# Patient Record
Sex: Male | Born: 1985 | Race: White | Hispanic: No | Marital: Married | Smoking: Former smoker
Health system: Southern US, Community
[De-identification: ages and names within clinical notes are randomized; demographics above are authoritative.]

## PROBLEM LIST (undated history)

## (undated) ENCOUNTER — Emergency Department (HOSPITAL_COMMUNITY): Admission: EM | Payer: BC Managed Care – PPO

## (undated) DIAGNOSIS — K529 Noninfective gastroenteritis and colitis, unspecified: Secondary | ICD-10-CM

## (undated) DIAGNOSIS — T8859XA Other complications of anesthesia, initial encounter: Secondary | ICD-10-CM

## (undated) DIAGNOSIS — M549 Dorsalgia, unspecified: Secondary | ICD-10-CM

## (undated) DIAGNOSIS — I10 Essential (primary) hypertension: Secondary | ICD-10-CM

## (undated) DIAGNOSIS — F909 Attention-deficit hyperactivity disorder, unspecified type: Secondary | ICD-10-CM

## (undated) DIAGNOSIS — E119 Type 2 diabetes mellitus without complications: Secondary | ICD-10-CM

## (undated) HISTORY — PX: WISDOM TOOTH EXTRACTION: SHX21

## (undated) HISTORY — PX: UMBILICAL HERNIA REPAIR: SHX196

---

## 2014-08-17 ENCOUNTER — Other Ambulatory Visit: Payer: Self-pay | Admitting: Internal Medicine

## 2014-08-17 ENCOUNTER — Other Ambulatory Visit (HOSPITAL_COMMUNITY): Payer: Self-pay | Admitting: Internal Medicine

## 2014-08-17 DIAGNOSIS — M545 Low back pain, unspecified: Secondary | ICD-10-CM

## 2014-08-20 ENCOUNTER — Other Ambulatory Visit: Payer: Self-pay | Admitting: Internal Medicine

## 2014-08-20 DIAGNOSIS — M795 Residual foreign body in soft tissue: Secondary | ICD-10-CM

## 2014-08-21 ENCOUNTER — Other Ambulatory Visit: Payer: Self-pay

## 2014-08-23 ENCOUNTER — Ambulatory Visit
Admission: RE | Admit: 2014-08-23 | Discharge: 2014-08-23 | Disposition: A | Discharge status: Home / Self Care | Payer: BC Managed Care – PPO | Source: Ambulatory Visit | Attending: Internal Medicine | Admitting: Internal Medicine

## 2014-08-23 ENCOUNTER — Other Ambulatory Visit: Payer: Self-pay | Admitting: Internal Medicine

## 2014-08-23 DIAGNOSIS — M795 Residual foreign body in soft tissue: Secondary | ICD-10-CM

## 2014-08-23 DIAGNOSIS — M545 Low back pain, unspecified: Secondary | ICD-10-CM

## 2014-12-22 ENCOUNTER — Emergency Department (HOSPITAL_COMMUNITY)
Admission: EM | Admit: 2014-12-22 | Discharge: 2014-12-22 | Disposition: A | Discharge status: Home / Self Care | Payer: BLUE CROSS/BLUE SHIELD | Attending: Emergency Medicine | Admitting: Emergency Medicine

## 2014-12-22 ENCOUNTER — Emergency Department (HOSPITAL_COMMUNITY): Payer: BLUE CROSS/BLUE SHIELD

## 2014-12-22 ENCOUNTER — Encounter (HOSPITAL_COMMUNITY): Payer: Self-pay | Admitting: Emergency Medicine

## 2014-12-22 DIAGNOSIS — Z880 Allergy status to penicillin: Secondary | ICD-10-CM | POA: Diagnosis not present

## 2014-12-22 DIAGNOSIS — R1012 Left upper quadrant pain: Secondary | ICD-10-CM | POA: Diagnosis not present

## 2014-12-22 DIAGNOSIS — R1032 Left lower quadrant pain: Secondary | ICD-10-CM | POA: Diagnosis not present

## 2014-12-22 DIAGNOSIS — R109 Unspecified abdominal pain: Secondary | ICD-10-CM | POA: Diagnosis present

## 2014-12-22 HISTORY — DX: Dorsalgia, unspecified: M54.9

## 2014-12-22 LAB — COMPREHENSIVE METABOLIC PANEL
ALT: 39 U/L (ref 0–53)
AST: 25 U/L (ref 0–37)
Albumin: 4 g/dL (ref 3.5–5.2)
Alkaline Phosphatase: 88 U/L (ref 39–117)
Anion gap: 9 (ref 5–15)
BILIRUBIN TOTAL: 0.5 mg/dL (ref 0.3–1.2)
BUN: 18 mg/dL (ref 6–23)
CALCIUM: 9.2 mg/dL (ref 8.4–10.5)
CHLORIDE: 103 mmol/L (ref 96–112)
CO2: 24 mmol/L (ref 19–32)
Creatinine, Ser: 0.83 mg/dL (ref 0.50–1.35)
GFR calc Af Amer: >90 mL/min (ref >90)
GFR calc non Af Amer: >90 mL/min (ref >90)
Glucose, Bld: 123 mg/dL — ABNORMAL HIGH (ref 70–99)
Potassium: 3.8 mmol/L (ref 3.5–5.1)
SODIUM: 136 mmol/L (ref 135–145)
Total Protein: 7.7 g/dL (ref 6.0–8.3)

## 2014-12-22 LAB — URINALYSIS, ROUTINE W REFLEX MICROSCOPIC
Bilirubin Urine: NEGATIVE
GLUCOSE, UA: NEGATIVE mg/dL
HGB URINE DIPSTICK: NEGATIVE
Ketones, ur: NEGATIVE mg/dL
LEUKOCYTES UA: NEGATIVE
Nitrite: NEGATIVE
PH: 5.5 (ref 5.0–8.0)
Protein, ur: NEGATIVE mg/dL
Specific Gravity, Urine: >1.03 — ABNORMAL HIGH (ref 1.005–1.030)
Urobilinogen, UA: 0.2 mg/dL (ref 0.0–1.0)

## 2014-12-22 LAB — CBC WITH DIFFERENTIAL/PLATELET
Basophils Absolute: 0.1 10*3/uL (ref 0.0–0.1)
Basophils Relative: 1 % (ref 0–1)
Eosinophils Absolute: 0.4 10*3/uL (ref 0.0–0.7)
Eosinophils Relative: 3 % (ref 0–5)
HEMATOCRIT: 45.4 % (ref 39.0–52.0)
HEMOGLOBIN: 16 g/dL (ref 13.0–17.0)
LYMPHS ABS: 3.9 10*3/uL (ref 0.7–4.0)
Lymphocytes Relative: 26 % (ref 12–46)
MCH: 30.2 pg (ref 26.0–34.0)
MCHC: 35.2 g/dL (ref 30.0–36.0)
MCV: 85.7 fL (ref 78.0–100.0)
MONOS PCT: 7 % (ref 3–12)
Monocytes Absolute: 1 10*3/uL (ref 0.1–1.0)
NEUTROS ABS: 9.2 10*3/uL — AB (ref 1.7–7.7)
NEUTROS PCT: 63 % (ref 43–77)
Platelets: 345 10*3/uL (ref 150–400)
RBC: 5.3 MIL/uL (ref 4.22–5.81)
RDW: 12.5 % (ref 11.5–15.5)
WBC: 14.6 10*3/uL — ABNORMAL HIGH (ref 4.0–10.5)

## 2014-12-22 LAB — LIPASE, BLOOD: Lipase: 19 U/L (ref 11–59)

## 2014-12-22 LAB — AMYLASE: AMYLASE: 35 U/L (ref 0–105)

## 2014-12-22 MED ORDER — IOHEXOL 300 MG/ML  SOLN
50.0000 mL | Freq: Once | INTRAMUSCULAR | Status: AC | PRN
  Administered 2014-12-22: 50 mL via ORAL

## 2014-12-22 MED ORDER — SODIUM CHLORIDE 0.9 % IV BOLUS (SEPSIS)
1000.0000 mL | Freq: Once | INTRAVENOUS | Status: AC
  Administered 2014-12-22: 1000 mL via INTRAVENOUS

## 2014-12-22 MED ORDER — SUCRALFATE 1 GM/10ML PO SUSP: 1.0000 g | Freq: Three times a day (TID) | ORAL | Status: DC

## 2014-12-22 MED ORDER — HYDROMORPHONE HCL 1 MG/ML IJ SOLN
1.0000 mg | Freq: Once | INTRAMUSCULAR | Status: AC
Start: 2014-12-22 — End: 2014-12-22
  Administered 2014-12-22: 1 mg via INTRAVENOUS
  Filled 2014-12-22: qty 1

## 2014-12-22 MED ORDER — IOHEXOL 300 MG/ML  SOLN
100.0000 mL | Freq: Once | INTRAMUSCULAR | Status: AC | PRN
  Administered 2014-12-22: 100 mL via INTRAVENOUS

## 2014-12-22 MED ORDER — PANTOPRAZOLE SODIUM 20 MG PO TBEC: 20.0000 mg | DELAYED_RELEASE_TABLET | Freq: Two times a day (BID) | ORAL | Status: DC

## 2014-12-22 MED ORDER — ONDANSETRON HCL 4 MG/2ML IJ SOLN
4.0000 mg | Freq: Once | INTRAMUSCULAR | Status: AC
  Administered 2014-12-22: 4 mg via INTRAVENOUS
  Filled 2014-12-22: qty 2

## 2014-12-22 NOTE — ED Provider Notes (Addendum)
CSN: [id number]     Arrival date & time 12/22/14  1604 History   First MD Initiated Contact with Patient 12/22/14 1730     Chief Complaint  Patient presents with  . Flank Pain     (Consider location/radiation/quality/duration/timing/severity/associated sxs/prior Treatment) HPI Comments: Patient presents to the ER for evaluation of pain on the left side of his abdomen. He reports that he has been experiencing pain for the last 3 days. Pain has been constant, dull aching on the left side but he has intermittent episodes of sharp stabbing pain. He has associated nausea but has not had vomiting. He has not had any diarrhea. Patient denies fever. He denies urinary symptoms.  Patient is a 30 y.o. male presenting with flank pain.  Flank Pain Associated symptoms include abdominal pain.    Past Medical History  Diagnosis Date  . Back pain    History reviewed. No pertinent past surgical history. History reviewed. No pertinent family history. History  Substance Use Topics  . Smoking status: Never Smoker   . Smokeless tobacco: Current User    Types: Snuff  . Alcohol Use: No    Review of Systems  Gastrointestinal: Positive for abdominal pain.  Genitourinary: Positive for flank pain.  All other systems reviewed and are negative.     Allergies  Penicillins  Home Medications   Prior to Admission medications   Medication Sig Start Date End Date Taking? Authorizing Provider  HYDROcodone-acetaminophen (NORCO/VICODIN) 5-325 MG per tablet Take 1 tablet by mouth daily as needed for moderate pain or severe pain.   Yes Historical Provider, MD   BP 153/113 mmHg  Pulse 90  Temp(Src) 97.9 F (36.6 C) (Oral)  Resp 18  Ht 6' 3"  (1.905 m)  Wt 345 lb (156.491 kg)  BMI 43.12 kg/m2  SpO2 100% Physical Exam  Constitutional: He is oriented to person, place, and time. He appears well-developed and well-nourished. No distress.  HENT:  Head: Normocephalic and atraumatic.  Right Ear: Hearing  normal.  Left Ear: Hearing normal.  Nose: Nose normal.  Mouth/Throat: Oropharynx is clear and moist and mucous membranes are normal.  Eyes: Conjunctivae and EOM are normal. Pupils are equal, round, and reactive to light.  Neck: Normal range of motion. Neck supple.  Cardiovascular: Regular rhythm, S1 normal and S2 normal.  Exam reveals no gallop and no friction rub.   No murmur heard. Pulmonary/Chest: Effort normal and breath sounds normal. No respiratory distress. He exhibits no tenderness.  Abdominal: Soft. Normal appearance and bowel sounds are normal. There is no hepatosplenomegaly. There is tenderness in the left upper quadrant and left lower quadrant. There is no rebound, no guarding, no tenderness at McBurney's point and negative Murphy's sign. No hernia.  Musculoskeletal: Normal range of motion.  Neurological: He is alert and oriented to person, place, and time. He has normal strength. No cranial nerve deficit or sensory deficit. Coordination normal. GCS eye subscore is 4. GCS verbal subscore is 5. GCS motor subscore is 6.  Skin: Skin is warm, dry and intact. No rash noted. No cyanosis.  Psychiatric: He has a normal mood and affect. His speech is normal and behavior is normal. Thought content normal.  Nursing note and vitals reviewed.   ED Course  Procedures (including critical care time) Labs Review Labs Reviewed  CBC WITH DIFFERENTIAL/PLATELET - Abnormal; Notable for the following:    WBC 14.6 (*)    Neutro Abs 9.2 (*)    All other components within normal limits  COMPREHENSIVE  METABOLIC PANEL - Abnormal; Notable for the following:    Glucose, Bld 123 (*)    All other components within normal limits  URINALYSIS, ROUTINE W REFLEX MICROSCOPIC - Abnormal; Notable for the following:    Specific Gravity, Urine >1.030 (*)    All other components within normal limits  LIPASE, BLOOD  AMYLASE    Imaging Review Ct Abdomen Pelvis W Contrast  12/22/2014   CLINICAL DATA:  LEFT lower  quadrant and LEFT upper quadrant pain for 3 days. Nausea. Constipation.  EXAM: CT ABDOMEN AND PELVIS WITH CONTRAST  TECHNIQUE: Multidetector CT imaging of the abdomen and pelvis was performed using the standard protocol following bolus administration of intravenous contrast.  CONTRAST:  70m OMNIPAQUE IOHEXOL 300 MG/ML SOLN, 1040mOMNIPAQUE IOHEXOL 300 MG/ML SOLN  COMPARISON:  None.  FINDINGS: Musculoskeletal: No aggressive osseous lesions. Schmorl's nodes throughout the thoracolumbar spine.  Lung Bases: Clear.  Liver:  Normal.  Spleen:  Normal.  Gallbladder:  Normal.  Common bile duct:  Normal.  Pancreas:  Normal.  Adrenal glands:  Normal.  Kidneys: Normal enhancement. No calculi. LEFT ureter normal. Phlebolith adjacent to the distal LEFT ureter. RIGHT ureter normal.  Stomach:  Normal.  Small bowel:  Normal.  Contrast extends to the distal jejunum.  Colon: Normal appendix. The colon is within normal limits with redundant sigmoid.  Pelvic Genitourinary:  Normal.  Peritoneum: No free fluid or free air.  Vasculature: Normal.  Body Wall: Fat containing periumbilical hernia.  IMPRESSION: Negative CT abdomen and pelvis.   Electronically Signed   By: GeDereck Ligas.D.   On: 12/22/2014 20:22     EKG Interpretation None      MDM   Final diagnoses:  Abdominal pain    Patient presents to the ER for evaluation of left-sided abdominal pain. Symptoms have been ongoing for 3 days. Patient is primarily experiencing left upper abdominal discomfort, but he does have pain down into the lower portion of his abdomen. He has associated nausea but no vomiting. Examination revealed diffuse tenderness on the left side of the abdomen without any right-sided tenderness. He does not have signs of peritonitis. Lab work was normal except for mild leukocytosis. Patient had CT scan to further evaluate. No acute abnormality is seen. Cause of the pain is unclear at this time. Patient provided analgesia, maximize antacid for  possible GERD/gastritis, will follow-up with gastroenterology and primary doctor.    ChOrpah GreekMD 12/22/14 2040  ChOrpah GreekMD 12/22/14 2103

## 2014-12-22 NOTE — Discharge Instructions (Signed)

## 2014-12-22 NOTE — ED Notes (Signed)
Pt states that he has been having left lower quad pain going into back for the past 3 days.  States feels nauseated but cannot vomit and has not been able to have a bowel movement easily.

## 2015-08-04 ENCOUNTER — Encounter (HOSPITAL_COMMUNITY): Payer: Self-pay | Admitting: Emergency Medicine

## 2015-08-04 ENCOUNTER — Emergency Department (HOSPITAL_COMMUNITY)
Admission: EM | Admit: 2015-08-04 | Discharge: 2015-08-04 | Disposition: A | Discharge status: Home / Self Care | Payer: BLUE CROSS/BLUE SHIELD | Attending: Emergency Medicine | Admitting: Emergency Medicine

## 2015-08-04 ENCOUNTER — Emergency Department (HOSPITAL_COMMUNITY): Payer: BLUE CROSS/BLUE SHIELD

## 2015-08-04 DIAGNOSIS — Z880 Allergy status to penicillin: Secondary | ICD-10-CM | POA: Diagnosis not present

## 2015-08-04 DIAGNOSIS — R079 Chest pain, unspecified: Secondary | ICD-10-CM | POA: Diagnosis present

## 2015-08-04 DIAGNOSIS — R0789 Other chest pain: Secondary | ICD-10-CM | POA: Insufficient documentation

## 2015-08-04 DIAGNOSIS — Z792 Long term (current) use of antibiotics: Secondary | ICD-10-CM | POA: Diagnosis not present

## 2015-08-04 LAB — CBC WITH DIFFERENTIAL/PLATELET
BASOS ABS: 0.1 10*3/uL (ref 0.0–0.1)
BASOS PCT: 1 %
EOS PCT: 5 %
Eosinophils Absolute: 0.5 10*3/uL (ref 0.0–0.7)
HCT: 44.8 % (ref 39.0–52.0)
Hemoglobin: 15.8 g/dL (ref 13.0–17.0)
LYMPHS PCT: 34 %
Lymphs Abs: 3.4 10*3/uL (ref 0.7–4.0)
MCH: 29.5 pg (ref 26.0–34.0)
MCHC: 35.3 g/dL (ref 30.0–36.0)
MCV: 83.7 fL (ref 78.0–100.0)
Monocytes Absolute: 0.7 10*3/uL (ref 0.1–1.0)
Monocytes Relative: 7 %
NEUTROS ABS: 5.5 10*3/uL (ref 1.7–7.7)
Neutrophils Relative %: 53 %
PLATELETS: 294 10*3/uL (ref 150–400)
RBC: 5.35 MIL/uL (ref 4.22–5.81)
RDW: 12.7 % (ref 11.5–15.5)
WBC: 10.1 10*3/uL (ref 4.0–10.5)

## 2015-08-04 LAB — COMPREHENSIVE METABOLIC PANEL
ALT: 34 U/L (ref 17–63)
AST: 29 U/L (ref 15–41)
Albumin: 3.8 g/dL (ref 3.5–5.0)
Alkaline Phosphatase: 95 U/L (ref 38–126)
Anion gap: 7 (ref 5–15)
BUN: 15 mg/dL (ref 6–20)
CHLORIDE: 104 mmol/L (ref 101–111)
CO2: 27 mmol/L (ref 22–32)
Calcium: 8.9 mg/dL (ref 8.9–10.3)
Creatinine, Ser: 0.79 mg/dL (ref 0.61–1.24)
GFR calc non Af Amer: >60 mL/min (ref >60)
Glucose, Bld: 106 mg/dL — ABNORMAL HIGH (ref 65–99)
Potassium: 4 mmol/L (ref 3.5–5.1)
SODIUM: 138 mmol/L (ref 135–145)
Total Bilirubin: 0.8 mg/dL (ref 0.3–1.2)
Total Protein: 7.4 g/dL (ref 6.5–8.1)

## 2015-08-04 LAB — TROPONIN I: Troponin I: <0.03 ng/mL (ref <0.031)

## 2015-08-04 MED ORDER — IBUPROFEN 800 MG PO TABS: 800.0000 mg | ORAL_TABLET | Freq: Three times a day (TID) | ORAL | Status: DC

## 2015-08-04 NOTE — ED Notes (Signed)
Pt reports midsternal cp since last night with diaphoresis and radiation to left arm. Pt alert and oriented, has not taken anything for pain, no cardiac hx.

## 2015-08-04 NOTE — Discharge Instructions (Signed)

## 2015-08-04 NOTE — ED Provider Notes (Signed)
CSN: [id number]     Arrival date & time 08/04/15  1022 History   First MD Initiated Contact with Patient 08/04/15 1131     Chief Complaint  Patient presents with  . Chest Pain     (Consider location/radiation/quality/duration/timing/severity/associated sxs/prior Treatment) Patient is a 29 y.o. male presenting with chest pain. The history is provided by the patient. No language interpreter was used.  Chest Pain Pain location:  Substernal area Pain quality: aching   Pain radiates to:  L arm Pain radiates to the back: no   Pain severity:  Moderate Onset quality:  Unable to specify Duration:  1 day Timing:  Constant Progression:  Worsening Chronicity:  New Context: breathing and movement   Context: not lifting   Relieved by:  Nothing Worsened by:  Nothing tried Associated symptoms: no abdominal pain     Past Medical History  Diagnosis Date  . Back pain    History reviewed. No pertinent past surgical history. History reviewed. No pertinent family history. Social History  Substance Use Topics  . Smoking status: Never Smoker   . Smokeless tobacco: Current User    Types: Snuff  . Alcohol Use: Yes     Comment: social    Review of Systems  Cardiovascular: Positive for chest pain.  Gastrointestinal: Negative for abdominal pain.  All other systems reviewed and are negative.     Allergies  Penicillins  Home Medications   Prior to Admission medications   Medication Sig Start Date End Date Taking? Authorizing Provider  HYDROcodone-acetaminophen (NORCO/VICODIN) 5-325 MG per tablet Take 1 tablet by mouth daily as needed for moderate pain or severe pain.   Yes Historical Provider, MD  pantoprazole (PROTONIX) 20 MG tablet Take 1 tablet (20 mg total) by mouth 2 (two) times daily. Patient taking differently: Take 20 mg by mouth daily as needed for heartburn or indigestion.  12/22/14  Yes Orpah Greek, MD  cefUROXime (CEFTIN) 500 MG tablet Take 1 tablet by mouth 2  (two) times daily. 07/20/15   Historical Provider, MD   BP 153/95 mmHg  Pulse 70  Temp(Src) 97.9 F (36.6 C) (Oral)  Resp 19  Ht 6' 3"  (1.905 m)  Wt 354 lb (160.573 kg)  BMI 44.25 kg/m2  SpO2 98% Physical Exam  Constitutional: He appears well-developed and well-nourished.  HENT:  Head: Normocephalic and atraumatic.  Eyes: Conjunctivae are normal. Pupils are equal, round, and reactive to light.  Neck: Normal range of motion. Neck supple.  Cardiovascular: Normal rate, regular rhythm, normal heart sounds and intact distal pulses.   Pulmonary/Chest: Effort normal.  Abdominal: Soft.  Musculoskeletal: Normal range of motion.  Neurological: He is alert.  Skin: Skin is warm.  Psychiatric: He has a normal mood and affect.  Nursing note and vitals reviewed.   ED Course  Procedures (including critical care time) Labs Review Labs Reviewed - No data to display  Imaging Review Dg Chest 2 View  08/04/2015  CLINICAL DATA:  Chest pain with diaphoresis. EXAM: CHEST  2 VIEW COMPARISON:  None. FINDINGS: Midline trachea. Borderline cardiomegaly. Mediastinal contours otherwise within normal limits. No pleural effusion or pneumothorax. No congestive failure. Clear lungs. IMPRESSION: Borderline cardiomegaly, without acute disease. Electronically Signed   By: Abigail Miyamoto M.D.   On: 08/04/2015 13:03   I have personally reviewed and evaluated these images and lab results as part of my medical decision-making.   EKG Interpretation   Date/Time:  Wednesday August 04 2015 10:31:55 EDT Ventricular Rate:  70 PR  Interval:  177 QRS Duration: 109 QT Interval:  387 QTC Calculation: 418 R Axis:   80 Text Interpretation:  Sinus rhythm Confirmed by ZAMMIT  MD, JOSEPH ([id number])  on 08/04/2015 2:21:30 PM      MDM no leg swelling, no tachycardia.   Pt has normal ekg, normal chest xray.   Pt has negative troponin.  Pt began approximately 24 hours ago.   Pt advised to follow up with Dr. Nevada Crane.  I suspect pain  is muscular.  I doubt PE.  I doubt cardiac etiology   Final diagnoses:  Chest wall pain     rx for ibuprofen avs on chest wall pain   Fransico Meadow, PA-C 08/04/15 Bee, PA-C 08/04/15 1425  Milton Ferguson, MD 08/05/15 1536

## 2019-01-28 ENCOUNTER — Other Ambulatory Visit: Payer: Self-pay

## 2019-01-28 ENCOUNTER — Other Ambulatory Visit: Payer: Self-pay | Admitting: Internal Medicine

## 2019-01-28 ENCOUNTER — Ambulatory Visit (HOSPITAL_COMMUNITY)
Admission: RE | Admit: 2019-01-28 | Discharge: 2019-01-28 | Disposition: A | Discharge status: Home / Self Care | Payer: Commercial Managed Care - PPO | Source: Ambulatory Visit | Attending: Internal Medicine | Admitting: Internal Medicine

## 2019-01-28 DIAGNOSIS — M545 Low back pain, unspecified: Secondary | ICD-10-CM

## 2019-02-03 ENCOUNTER — Other Ambulatory Visit: Payer: Self-pay | Admitting: Internal Medicine

## 2019-02-03 DIAGNOSIS — M545 Low back pain, unspecified: Secondary | ICD-10-CM

## 2019-12-15 ENCOUNTER — Emergency Department (HOSPITAL_COMMUNITY): Payer: BC Managed Care – PPO

## 2019-12-15 ENCOUNTER — Encounter (HOSPITAL_COMMUNITY): Payer: Self-pay | Admitting: Emergency Medicine

## 2019-12-15 ENCOUNTER — Inpatient Hospital Stay (HOSPITAL_COMMUNITY)
Admission: EM | Admit: 2019-12-15 | Discharge: 2019-12-21 | DRG: 392 | Disposition: A | Discharge status: Home / Self Care | Payer: BC Managed Care – PPO | Attending: Internal Medicine | Admitting: Internal Medicine

## 2019-12-15 DIAGNOSIS — D751 Secondary polycythemia: Secondary | ICD-10-CM

## 2019-12-15 DIAGNOSIS — K5100 Ulcerative (chronic) pancolitis without complications: Secondary | ICD-10-CM | POA: Diagnosis not present

## 2019-12-15 DIAGNOSIS — F17290 Nicotine dependence, other tobacco product, uncomplicated: Secondary | ICD-10-CM | POA: Diagnosis present

## 2019-12-15 DIAGNOSIS — Z20822 Contact with and (suspected) exposure to covid-19: Secondary | ICD-10-CM | POA: Diagnosis present

## 2019-12-15 DIAGNOSIS — E860 Dehydration: Secondary | ICD-10-CM | POA: Diagnosis present

## 2019-12-15 DIAGNOSIS — Z8719 Personal history of other diseases of the digestive system: Secondary | ICD-10-CM

## 2019-12-15 DIAGNOSIS — Z7984 Long term (current) use of oral hypoglycemic drugs: Secondary | ICD-10-CM

## 2019-12-15 DIAGNOSIS — E872 Acidosis: Secondary | ICD-10-CM | POA: Diagnosis present

## 2019-12-15 DIAGNOSIS — Z79899 Other long term (current) drug therapy: Secondary | ICD-10-CM

## 2019-12-15 DIAGNOSIS — R6510 Systemic inflammatory response syndrome (SIRS) of non-infectious origin without acute organ dysfunction: Secondary | ICD-10-CM

## 2019-12-15 DIAGNOSIS — Z6841 Body Mass Index (BMI) 40.0 and over, adult: Secondary | ICD-10-CM

## 2019-12-15 DIAGNOSIS — A09 Infectious gastroenteritis and colitis, unspecified: Principal | ICD-10-CM | POA: Diagnosis present

## 2019-12-15 DIAGNOSIS — Z86010 Personal history of colonic polyps: Secondary | ICD-10-CM

## 2019-12-15 DIAGNOSIS — K635 Polyp of colon: Secondary | ICD-10-CM | POA: Diagnosis present

## 2019-12-15 DIAGNOSIS — Z800 Family history of malignant neoplasm of digestive organs: Secondary | ICD-10-CM

## 2019-12-15 DIAGNOSIS — R9431 Abnormal electrocardiogram [ECG] [EKG]: Secondary | ICD-10-CM | POA: Diagnosis present

## 2019-12-15 DIAGNOSIS — E876 Hypokalemia: Secondary | ICD-10-CM | POA: Diagnosis present

## 2019-12-15 DIAGNOSIS — I10 Essential (primary) hypertension: Secondary | ICD-10-CM | POA: Diagnosis present

## 2019-12-15 DIAGNOSIS — E119 Type 2 diabetes mellitus without complications: Secondary | ICD-10-CM

## 2019-12-15 DIAGNOSIS — E1165 Type 2 diabetes mellitus with hyperglycemia: Secondary | ICD-10-CM | POA: Diagnosis present

## 2019-12-15 DIAGNOSIS — E1140 Type 2 diabetes mellitus with diabetic neuropathy, unspecified: Secondary | ICD-10-CM | POA: Diagnosis present

## 2019-12-15 HISTORY — DX: Type 2 diabetes mellitus without complications: E11.9

## 2019-12-15 HISTORY — DX: Essential (primary) hypertension: I10

## 2019-12-15 LAB — URINALYSIS, ROUTINE W REFLEX MICROSCOPIC
Bacteria, UA: NONE SEEN
Bilirubin Urine: NEGATIVE
Glucose, UA: 150 mg/dL — AB
Hgb urine dipstick: NEGATIVE
Ketones, ur: NEGATIVE mg/dL
Leukocytes,Ua: NEGATIVE
Nitrite: NEGATIVE
Protein, ur: 30 mg/dL — AB
Specific Gravity, Urine: 1.033 — ABNORMAL HIGH (ref 1.005–1.030)
pH: 5 (ref 5.0–8.0)

## 2019-12-15 LAB — CBC
HCT: 56.4 % — ABNORMAL HIGH (ref 39.0–52.0)
Hemoglobin: 19.4 g/dL — ABNORMAL HIGH (ref 13.0–17.0)
MCH: 28.9 pg (ref 26.0–34.0)
MCHC: 34.4 g/dL (ref 30.0–36.0)
MCV: 84.1 fL (ref 80.0–100.0)
Platelets: 394 10*3/uL (ref 150–400)
RBC: 6.71 MIL/uL — ABNORMAL HIGH (ref 4.22–5.81)
RDW: 12.1 % (ref 11.5–15.5)
WBC: 17.6 10*3/uL — ABNORMAL HIGH (ref 4.0–10.5)
nRBC: 0 % (ref 0.0–0.2)

## 2019-12-15 LAB — COMPREHENSIVE METABOLIC PANEL
ALT: 30 U/L (ref 0–44)
AST: 17 U/L (ref 15–41)
Albumin: 3.6 g/dL (ref 3.5–5.0)
Alkaline Phosphatase: 92 U/L (ref 38–126)
Anion gap: 13 (ref 5–15)
BUN: 20 mg/dL (ref 6–20)
CO2: 16 mmol/L — ABNORMAL LOW (ref 22–32)
Calcium: 9.2 mg/dL (ref 8.9–10.3)
Chloride: 108 mmol/L (ref 98–111)
Creatinine, Ser: 0.89 mg/dL (ref 0.61–1.24)
GFR calc Af Amer: >60 mL/min (ref >60)
GFR calc non Af Amer: >60 mL/min (ref >60)
Glucose, Bld: 239 mg/dL — ABNORMAL HIGH (ref 70–99)
Potassium: 4.4 mmol/L (ref 3.5–5.1)
Sodium: 137 mmol/L (ref 135–145)
Total Bilirubin: 0.6 mg/dL (ref 0.3–1.2)
Total Protein: 7.6 g/dL (ref 6.5–8.1)

## 2019-12-15 LAB — LIPASE, BLOOD: Lipase: 25 U/L (ref 11–51)

## 2019-12-15 MED ORDER — HYDROMORPHONE HCL 1 MG/ML IJ SOLN
1.0000 mg | Freq: Once | INTRAMUSCULAR | Status: AC
  Administered 2019-12-16: 1 mg via INTRAVENOUS
  Filled 2019-12-15: qty 1

## 2019-12-15 MED ORDER — LEVOFLOXACIN IN D5W 750 MG/150ML IV SOLN: 750.0000 mg | Freq: Once | INTRAVENOUS | Status: DC

## 2019-12-15 MED ORDER — SODIUM CHLORIDE 0.9 % IV SOLN
2.0000 g | INTRAVENOUS | Status: DC
  Administered 2019-12-16 – 2019-12-20 (×6): 2 g via INTRAVENOUS
  Filled 2019-12-15: qty 20
  Filled 2019-12-15 (×2): qty 2
  Filled 2019-12-15: qty 20
  Filled 2019-12-15 (×2): qty 2

## 2019-12-15 MED ORDER — ONDANSETRON HCL 4 MG/2ML IJ SOLN
4.0000 mg | Freq: Once | INTRAMUSCULAR | Status: AC
  Administered 2019-12-16: 4 mg via INTRAVENOUS
  Filled 2019-12-15: qty 2

## 2019-12-15 MED ORDER — SODIUM CHLORIDE 0.9% FLUSH: 3.0000 mL | Freq: Once | INTRAVENOUS | Status: DC

## 2019-12-15 MED ORDER — SODIUM CHLORIDE 0.9 % IV BOLUS
1000.0000 mL | Freq: Once | INTRAVENOUS | Status: AC
  Administered 2019-12-16: 1000 mL via INTRAVENOUS

## 2019-12-15 MED ORDER — METRONIDAZOLE IN NACL 5-0.79 MG/ML-% IV SOLN
500.0000 mg | Freq: Once | INTRAVENOUS | Status: AC
  Administered 2019-12-16: 500 mg via INTRAVENOUS
  Filled 2019-12-15: qty 100

## 2019-12-15 NOTE — ED Triage Notes (Signed)
Pt in POV. Reports RUQ abd pain, N/V/D since this AM. Pt appears to be in discomfort in triage. Tachycardic.

## 2019-12-15 NOTE — ED Provider Notes (Signed)
[facility] EMERGENCY DEPARTMENT Provider Note   CSN: [id number] Arrival date & time: 12/15/19  2056     History Chief Complaint  Patient presents with  . Abdominal Cramping    Patrick Munoz is a 34 y.o. male.  Patient with past medical history notable for hypertension and diabetes presents to the emergency department with a chief complaint of severe abdominal pain.  He states pain started this morning.  He reports associated nausea, vomiting, and diarrhea.  He states the pain is severe.  Denies any successful treatments prior to arrival.  He states that he has had a small amount of blood in his vomit.  Denies ever experiencing anything like this before.  Denies any fever, chills, shortness of breath.  Denies any dysuria or hematuria.  The history is provided by the patient. No language interpreter was used.       Past Medical History:  Diagnosis Date  . Back pain   . Diabetes mellitus without complication ([place])   . Hypertension     There are no problems to display for this patient.   History reviewed. No pertinent surgical history.     No family history on file.  Social History   Tobacco Use  . Smoking status: Never Smoker  . Smokeless tobacco: Current User    Types: Snuff  Substance Use Topics  . Alcohol use: Yes    Comment: social  . Drug use: No    Home Medications Prior to Admission medications   Medication Sig Start Date End Date Taking? Authorizing Provider  albuterol (VENTOLIN HFA) 108 (90 Base) MCG/ACT inhaler Inhale 2 puffs into the lungs every 4 (four) hours as needed. 10/28/19  Yes [provider]  glimepiride (AMARYL) 2 MG tablet Take 2 mg by mouth every morning. 11/25/19  Yes [provider]  lisinopril (ZESTRIL) 5 MG tablet Take 5 mg by mouth daily. 12/10/19  Yes [provider]  pregabalin (LYRICA) 100 MG capsule Take 100 mg by mouth 2 (two) times daily. 11/25/19  Yes [provider]    TRULICITY 1.5 KC/1.2XN SOPN Inject 1.5 mg into the skin once a week. Tuesdays 11/28/19  Yes [provider]  ibuprofen (ADVIL,MOTRIN) 800 MG tablet Take 1 tablet (800 mg total) by mouth 3 (three) times daily. Patient not taking: Reported on 12/15/2019 08/04/15   Fransico Meadow, PA-C  pantoprazole (PROTONIX) 20 MG tablet Take 1 tablet (20 mg total) by mouth 2 (two) times daily. Patient not taking: Reported on 12/15/2019 12/22/14   Orpah Greek, MD    Allergies    Penicillins  Review of Systems   Review of Systems  All other systems reviewed and are negative.   Physical Exam Updated Vital Signs BP (!) 153/110 (BP Location: Right Arm)   Pulse (!) 129   Temp 98.1 F (36.7 C) (Oral)   Resp (!) 22   Ht 6' 3"  (1.905 m)   Wt (!) 145.2 kg   SpO2 97%   BMI 40.00 kg/m   Physical Exam Vitals and nursing note reviewed.  Constitutional:      Appearance: He is well-developed.  HENT:     Head: Normocephalic and atraumatic.  Eyes:     Conjunctiva/sclera: Conjunctivae normal.  Cardiovascular:     Rate and Rhythm: Regular rhythm. Tachycardia present.     Heart sounds: No murmur.  Pulmonary:     Effort: Pulmonary effort is normal. No respiratory distress.     Breath sounds: Normal breath  sounds.  Abdominal:     Palpations: Abdomen is soft.     Tenderness: There is abdominal tenderness.     Comments: Diffusely tender to palpation, concerning for peritonitis  Musculoskeletal:        General: Normal range of motion.     Cervical back: Neck supple.  Skin:    General: Skin is warm and dry.  Neurological:     Mental Status: He is alert and oriented to person, place, and time.  Psychiatric:        Mood and Affect: Mood normal.        Behavior: Behavior normal.     ED Results / Procedures / Treatments   Labs (all labs ordered are listed, but only abnormal results are displayed) Labs Reviewed  COMPREHENSIVE METABOLIC PANEL - Abnormal; Notable for the following  components:      Result Value   CO2 16 (*)    Glucose, Bld 239 (*)    All other components within normal limits  CBC - Abnormal; Notable for the following components:   WBC 17.6 (*)    RBC 6.71 (*)    Hemoglobin 19.4 (*)    HCT 56.4 (*)    All other components within normal limits  URINALYSIS, ROUTINE W REFLEX MICROSCOPIC - Abnormal; Notable for the following components:   Color, Urine AMBER (*)    APPearance HAZY (*)    Specific Gravity, Urine 1.033 (*)    Glucose, UA 150 (*)    Protein, ur 30 (*)    All other components within normal limits  LIPASE, BLOOD    EKG EKG Interpretation  Date/Time:  Monday December 15 2019 21:45:37 EST Ventricular Rate:  134 PR Interval:  144 QRS Duration: 86 QT Interval:  276 QTC Calculation: 412 R Axis:   66 Text Interpretation: Sinus tachycardia T wave abnormality, consider inferior ischemia Abnormal ECG Confirmed by Thayer Jew [phone]) on 12/16/2019 1:29:24 AM   Radiology CT ABDOMEN PELVIS W CONTRAST  Result Date: 12/16/2019 CLINICAL DATA:  Right lower quadrant abdominal pain, cramping, nausea and vomiting EXAM: CT ABDOMEN AND PELVIS WITH CONTRAST TECHNIQUE: Multidetector CT imaging of the abdomen and pelvis was performed using the standard protocol following bolus administration of intravenous contrast. CONTRAST:  189m OMNIPAQUE IOHEXOL 300 MG/ML  SOLN COMPARISON:  Comparison CT 12/22/2014 FINDINGS: Lower chest: Lung bases are clear. Normal heart size. No pericardial effusion. Hepatobiliary: Diffuse hepatic hypoattenuation compatible with hepatic steatosis. No focal liver abnormality is seen. No gallstones, gallbladder wall thickening, or biliary dilatation. Pancreas: Unremarkable. No pancreatic ductal dilatation or surrounding inflammatory changes. Spleen: Normal in size without focal abnormality. Adrenals/Urinary Tract: Adrenal glands are unremarkable. Kidneys are normal, without renal calculi, focal lesion, or hydronephrosis. Bladder is  unremarkable. Stomach/Bowel: Distal esophagus, stomach and duodenal sweep are unremarkable. No small bowel wall thickening or dilatation. No evidence of obstruction. Normal appendix in the right lower quadrant (6/68) question some mild pancolonic edematous mural thickening and faint mucosal hyperemia. Vascular/Lymphatic: The aorta is normal caliber. No pathologically enlarged lymph nodes in the abdomen or pelvis. Major venous structures are unremarkable. Reproductive: The prostate and seminal vesicles are unremarkable. Other: No abdominopelvic free fluid or free gas. No bowel containing hernias. Musculoskeletal: No acute osseous abnormality or suspicious osseous lesion. Multilevel Schmorl's node formations in the vertebral bodies are similar to comparison study. IMPRESSION: 1. Pancolonic edematous changes could reflect an acute infectious or inflammatory pancolitis. 2. Normal appendix. 3. Hepatic steatosis. Electronically Signed   By: PElwin SleightD.  On: 12/16/2019 00:40   DG Chest Port 1 View  Result Date: 12/15/2019 CLINICAL DATA:  Right upper quadrant pain EXAM: PORTABLE CHEST 1 VIEW COMPARISON:  08/04/2015 FINDINGS: Borderline cardiomegaly. No focal opacity or pleural effusion. No pneumothorax. IMPRESSION: No active disease.  Borderline cardiomegaly Electronically Signed   By: Donavan Foil M.D.   On: 12/15/2019 23:32    Procedures .Critical Care Performed by: Montine Circle, PA-C Authorized by: Montine Circle, PA-C   Critical care provider statement:    Critical care time (minutes):  42   Critical care was necessary to treat or prevent imminent or life-threatening deterioration of the following conditions:  Sepsis   Critical care was time spent personally by me on the following activities:  Discussions with consultants, evaluation of patient's response to treatment, examination of patient, ordering and performing treatments and interventions, ordering and review of laboratory studies,  ordering and review of radiographic studies, pulse oximetry, re-evaluation of patient's condition, obtaining history from patient or surrogate and review of old charts   (including critical care time)  Medications Ordered in ED Medications  sodium chloride flush (NS) 0.9 % injection 3 mL (has no administration in time range)  cefTRIAXone (ROCEPHIN) 2 g in sodium chloride 0.9 % 100 mL IVPB (0 g Intravenous Stopped 12/16/19 0053)  HYDROmorphone (DILAUDID) injection 1 mg (1 mg Intravenous Given 12/16/19 0025)  ondansetron (ZOFRAN) injection 4 mg (4 mg Intravenous Given 12/16/19 0020)  sodium chloride 0.9 % bolus 1,000 mL (0 mLs Intravenous Stopped 12/16/19 0158)  metroNIDAZOLE (FLAGYL) IVPB 500 mg (0 mg Intravenous Stopped 12/16/19 0158)  iohexol (OMNIPAQUE) 300 MG/ML solution 100 mL (100 mLs Intravenous Contrast Given 12/16/19 0006)  sodium chloride 0.9 % bolus 1,000 mL (1,000 mLs Intravenous New Bag/Given 12/16/19 0206)  ondansetron (ZOFRAN) injection 4 mg (4 mg Intravenous Given 12/16/19 0206)    ED Course  I have reviewed the triage vital signs and the nursing notes.  Pertinent labs & imaging results that were available during my care of the patient were reviewed by me and considered in my medical decision making (see chart for details).    MDM Rules/Calculators/A&P                     Patient with severe abdominal pain that has been worsening throughout the day today.  He has associated nausea, vomiting, diarrhea.  He is very tender to palpation.  I do have concern for peritonitis.  He is tachycardic to 129, respiration rate 22, leukocytosis is 17.6.  CT abdomen pelvis show pancolitis, thought to be infectious given significant leukocytosis and vitals.  Plan for admission.  Discussed with Dr. Dina Rich, who agrees with the plan.  2:27 AM Appreciate Dr. Marlowe Sax for admitting.  Final Clinical Impression(s) / ED Diagnoses Final diagnoses:  Pancolitis [facility])    Rx / DC Orders ED Discharge  Orders    None       Montine Circle, PA-C 12/16/19 6503    Merryl Hacker, MD 12/16/19 [phone]

## 2019-12-16 ENCOUNTER — Encounter (HOSPITAL_COMMUNITY): Payer: Self-pay | Admitting: Internal Medicine

## 2019-12-16 ENCOUNTER — Emergency Department (HOSPITAL_COMMUNITY): Payer: BC Managed Care – PPO

## 2019-12-16 DIAGNOSIS — D751 Secondary polycythemia: Secondary | ICD-10-CM | POA: Diagnosis present

## 2019-12-16 DIAGNOSIS — E119 Type 2 diabetes mellitus without complications: Secondary | ICD-10-CM | POA: Diagnosis not present

## 2019-12-16 DIAGNOSIS — Z79899 Other long term (current) drug therapy: Secondary | ICD-10-CM | POA: Diagnosis not present

## 2019-12-16 DIAGNOSIS — Z20822 Contact with and (suspected) exposure to covid-19: Secondary | ICD-10-CM | POA: Diagnosis present

## 2019-12-16 DIAGNOSIS — E872 Acidosis: Secondary | ICD-10-CM | POA: Diagnosis present

## 2019-12-16 DIAGNOSIS — I10 Essential (primary) hypertension: Secondary | ICD-10-CM | POA: Diagnosis present

## 2019-12-16 DIAGNOSIS — K635 Polyp of colon: Secondary | ICD-10-CM | POA: Diagnosis present

## 2019-12-16 DIAGNOSIS — E1165 Type 2 diabetes mellitus with hyperglycemia: Secondary | ICD-10-CM | POA: Diagnosis present

## 2019-12-16 DIAGNOSIS — E1140 Type 2 diabetes mellitus with diabetic neuropathy, unspecified: Secondary | ICD-10-CM | POA: Diagnosis present

## 2019-12-16 DIAGNOSIS — K5100 Ulcerative (chronic) pancolitis without complications: Secondary | ICD-10-CM | POA: Diagnosis present

## 2019-12-16 DIAGNOSIS — F17290 Nicotine dependence, other tobacco product, uncomplicated: Secondary | ICD-10-CM | POA: Diagnosis present

## 2019-12-16 DIAGNOSIS — Z6841 Body Mass Index (BMI) 40.0 and over, adult: Secondary | ICD-10-CM | POA: Diagnosis not present

## 2019-12-16 DIAGNOSIS — Z800 Family history of malignant neoplasm of digestive organs: Secondary | ICD-10-CM | POA: Diagnosis not present

## 2019-12-16 DIAGNOSIS — R6510 Systemic inflammatory response syndrome (SIRS) of non-infectious origin without acute organ dysfunction: Secondary | ICD-10-CM | POA: Diagnosis not present

## 2019-12-16 DIAGNOSIS — R9431 Abnormal electrocardiogram [ECG] [EKG]: Secondary | ICD-10-CM | POA: Diagnosis present

## 2019-12-16 DIAGNOSIS — Z7984 Long term (current) use of oral hypoglycemic drugs: Secondary | ICD-10-CM | POA: Diagnosis not present

## 2019-12-16 DIAGNOSIS — Z86010 Personal history of colonic polyps: Secondary | ICD-10-CM | POA: Diagnosis not present

## 2019-12-16 DIAGNOSIS — E876 Hypokalemia: Secondary | ICD-10-CM | POA: Diagnosis present

## 2019-12-16 DIAGNOSIS — E860 Dehydration: Secondary | ICD-10-CM | POA: Diagnosis present

## 2019-12-16 DIAGNOSIS — A09 Infectious gastroenteritis and colitis, unspecified: Secondary | ICD-10-CM | POA: Diagnosis present

## 2019-12-16 DIAGNOSIS — Z8719 Personal history of other diseases of the digestive system: Secondary | ICD-10-CM | POA: Diagnosis not present

## 2019-12-16 LAB — CBC
HCT: 54.6 % — ABNORMAL HIGH (ref 39.0–52.0)
Hemoglobin: 18.8 g/dL — ABNORMAL HIGH (ref 13.0–17.0)
MCH: 29.5 pg (ref 26.0–34.0)
MCHC: 34.4 g/dL (ref 30.0–36.0)
MCV: 85.6 fL (ref 80.0–100.0)
Platelets: 358 10*3/uL (ref 150–400)
RBC: 6.38 MIL/uL — ABNORMAL HIGH (ref 4.22–5.81)
RDW: 12.2 % (ref 11.5–15.5)
WBC: 19.1 10*3/uL — ABNORMAL HIGH (ref 4.0–10.5)
nRBC: 0 % (ref 0.0–0.2)

## 2019-12-16 LAB — SEDIMENTATION RATE: Sed Rate: 5 mm/hr (ref 0–16)

## 2019-12-16 LAB — C DIFFICILE QUICK SCREEN W PCR REFLEX
C Diff antigen: NEGATIVE
C Diff interpretation: NOT DETECTED
C Diff toxin: NEGATIVE

## 2019-12-16 LAB — SARS CORONAVIRUS 2 (TAT 6-24 HRS): SARS Coronavirus 2: NEGATIVE

## 2019-12-16 LAB — GLUCOSE, CAPILLARY
Glucose-Capillary: 160 mg/dL — ABNORMAL HIGH (ref 70–99)
Glucose-Capillary: 182 mg/dL — ABNORMAL HIGH (ref 70–99)
Glucose-Capillary: 196 mg/dL — ABNORMAL HIGH (ref 70–99)
Glucose-Capillary: 221 mg/dL — ABNORMAL HIGH (ref 70–99)
Glucose-Capillary: 227 mg/dL — ABNORMAL HIGH (ref 70–99)

## 2019-12-16 LAB — TROPONIN I (HIGH SENSITIVITY): Troponin I (High Sensitivity): 7 ng/L (ref <18)

## 2019-12-16 LAB — APTT: aPTT: 31 seconds (ref 24–36)

## 2019-12-16 LAB — C-REACTIVE PROTEIN: CRP: 1.5 mg/dL — ABNORMAL HIGH (ref <1.0)

## 2019-12-16 LAB — LACTIC ACID, PLASMA: Lactic Acid, Venous: 1.8 mmol/L (ref 0.5–1.9)

## 2019-12-16 LAB — PROTIME-INR
INR: 1.1 (ref 0.8–1.2)
Prothrombin Time: 13.9 seconds (ref 11.4–15.2)

## 2019-12-16 LAB — CBG MONITORING, ED: Glucose-Capillary: 251 mg/dL — ABNORMAL HIGH (ref 70–99)

## 2019-12-16 LAB — HEMOGLOBIN A1C
Hgb A1c MFr Bld: 8.8 % — ABNORMAL HIGH (ref 4.8–5.6)
Mean Plasma Glucose: 205.86 mg/dL

## 2019-12-16 LAB — HIV ANTIBODY (ROUTINE TESTING W REFLEX): HIV Screen 4th Generation wRfx: NONREACTIVE

## 2019-12-16 MED ORDER — IOHEXOL 300 MG/ML  SOLN
100.0000 mL | Freq: Once | INTRAMUSCULAR | Status: AC | PRN
  Administered 2019-12-16: 100 mL via INTRAVENOUS

## 2019-12-16 MED ORDER — INSULIN ASPART 100 UNIT/ML ~~LOC~~ SOLN
0.0000 [IU] | SUBCUTANEOUS | Status: DC
  Administered 2019-12-16: 2 [IU] via SUBCUTANEOUS
  Administered 2019-12-16 (×2): 3 [IU] via SUBCUTANEOUS
  Administered 2019-12-16: 2 [IU] via SUBCUTANEOUS
  Administered 2019-12-16: 5 [IU] via SUBCUTANEOUS
  Administered 2019-12-17 – 2019-12-18 (×9): 2 [IU] via SUBCUTANEOUS

## 2019-12-16 MED ORDER — SODIUM CHLORIDE 0.9 % IV SOLN: INTRAVENOUS | Status: DC

## 2019-12-16 MED ORDER — LISINOPRIL 5 MG PO TABS
5.0000 mg | ORAL_TABLET | Freq: Every day | ORAL | Status: DC
  Administered 2019-12-16 – 2019-12-21 (×6): 5 mg via ORAL
  Filled 2019-12-16 (×6): qty 1

## 2019-12-16 MED ORDER — ACETAMINOPHEN 325 MG PO TABS: 650.0000 mg | ORAL_TABLET | Freq: Four times a day (QID) | ORAL | Status: DC | PRN

## 2019-12-16 MED ORDER — ONDANSETRON HCL 4 MG/2ML IJ SOLN
4.0000 mg | Freq: Four times a day (QID) | INTRAMUSCULAR | Status: DC | PRN
  Administered 2019-12-16: 4 mg via INTRAVENOUS
  Filled 2019-12-16: qty 2

## 2019-12-16 MED ORDER — ACETAMINOPHEN 650 MG RE SUPP: 650.0000 mg | Freq: Four times a day (QID) | RECTAL | Status: DC | PRN

## 2019-12-16 MED ORDER — ENOXAPARIN SODIUM 80 MG/0.8ML ~~LOC~~ SOLN
70.0000 mg | SUBCUTANEOUS | Status: DC
  Administered 2019-12-16 – 2019-12-20 (×5): 70 mg via SUBCUTANEOUS
  Filled 2019-12-16 (×3): qty 0.8
  Filled 2019-12-16: qty 0.7
  Filled 2019-12-16 (×3): qty 0.8

## 2019-12-16 MED ORDER — HYDRALAZINE HCL 20 MG/ML IJ SOLN
10.0000 mg | INTRAMUSCULAR | Status: DC | PRN
  Administered 2019-12-20: 10 mg via INTRAVENOUS
  Filled 2019-12-16: qty 1

## 2019-12-16 MED ORDER — METRONIDAZOLE IN NACL 5-0.79 MG/ML-% IV SOLN
500.0000 mg | Freq: Three times a day (TID) | INTRAVENOUS | Status: DC
  Administered 2019-12-16 – 2019-12-18 (×7): 500 mg via INTRAVENOUS
  Filled 2019-12-16 (×7): qty 100

## 2019-12-16 MED ORDER — SODIUM CHLORIDE 0.9 % IV BOLUS
1000.0000 mL | Freq: Once | INTRAVENOUS | Status: AC
  Administered 2019-12-16: 1000 mL via INTRAVENOUS

## 2019-12-16 MED ORDER — MORPHINE SULFATE (PF) 2 MG/ML IV SOLN
2.0000 mg | INTRAVENOUS | Status: DC | PRN
  Administered 2019-12-16 – 2019-12-20 (×14): 2 mg via INTRAVENOUS
  Filled 2019-12-16 (×16): qty 1

## 2019-12-16 MED ORDER — NICOTINE 14 MG/24HR TD PT24
14.0000 mg | MEDICATED_PATCH | Freq: Every day | TRANSDERMAL | Status: DC
  Administered 2019-12-16 – 2019-12-20 (×5): 14 mg via TRANSDERMAL
  Filled 2019-12-16 (×6): qty 1

## 2019-12-16 MED ORDER — ONDANSETRON HCL 4 MG/2ML IJ SOLN
4.0000 mg | Freq: Once | INTRAMUSCULAR | Status: AC
  Administered 2019-12-16: 4 mg via INTRAVENOUS
  Filled 2019-12-16: qty 2

## 2019-12-16 MED ORDER — ALBUTEROL SULFATE (2.5 MG/3ML) 0.083% IN NEBU: 2.5000 mg | INHALATION_SOLUTION | RESPIRATORY_TRACT | Status: DC | PRN

## 2019-12-16 NOTE — ED Notes (Signed)
Attempted to call report

## 2019-12-16 NOTE — Consult Note (Signed)
UNASSIGNED GI CONSULT  Reason for Consult: Colitis Referring Physician: [facility]  Kateri Mc HPI: This is a 34 year old male with a PMH of HTN and DM admitted for complaints of abdominal pain, diarrhea, and vomiting.  The patient states the he started to suffer with abdominal pain in the RUQ 3 days ago, on Saturday.  This was followed by vomiting and diarrhea.  The patient reported similar pain one week prior to this past Saturday, but the symptoms were minor and it resolved quickly.  His symptoms continued to persist and he presented to the ER.  A CT scan shows a diffuse mild pan colitis.  His WBC is elevated at 17.6 - 19.1 and his C. Diff is negative.  The GI pathogen panel is still pending  Past Medical History:  Diagnosis Date  . Back pain   . Diabetes mellitus without complication ([place])   . Hypertension     History reviewed. No pertinent surgical history.  Family History  Problem Relation Age of Onset  . Colon cancer Maternal Grandfather   . Stomach cancer Maternal Grandfather     Social History:  reports that he has never smoked. His smokeless tobacco use includes snuff. He reports current alcohol use. He reports that he does not use drugs.  Allergies:   Medications:  Scheduled: . enoxaparin (LOVENOX) injection  70 mg Subcutaneous Q24H  . insulin aspart  0-9 Units Subcutaneous Q4H  . lisinopril  5 mg Oral Daily  . nicotine  14 mg Transdermal Daily  . sodium chloride flush  3 mL Intravenous Once   Continuous: . sodium chloride 75 mL/hr at 12/16/19 1327  . cefTRIAXone (ROCEPHIN)  IV Stopped (12/16/19 0053)  . metronidazole 500 mg (12/16/19 0906)    Results for orders placed or performed during the hospital encounter of 12/15/19 (from the past 24 hour(s))  Urinalysis, Routine w reflex microscopic     Status: Abnormal   Collection Time: 12/15/19  9:40 PM  Result Value Ref Range   Color, Urine AMBER (A) YELLOW   APPearance HAZY (A) CLEAR   Specific  Gravity, Urine 1.033 (H) 1.005 - 1.030   pH 5.0 5.0 - 8.0   Glucose, UA 150 (A) NEGATIVE mg/dL   Hgb urine dipstick NEGATIVE NEGATIVE   Bilirubin Urine NEGATIVE NEGATIVE   Ketones, ur NEGATIVE NEGATIVE mg/dL   Protein, ur 30 (A) NEGATIVE mg/dL   Nitrite NEGATIVE NEGATIVE   Leukocytes,Ua NEGATIVE NEGATIVE   RBC / HPF 0-5 0 - 5 RBC/hpf   WBC, UA 0-5 0 - 5 WBC/hpf   Bacteria, UA NONE SEEN NONE SEEN   Squamous Epithelial / LPF 0-5 0 - 5   Mucus PRESENT    Ca Oxalate Crys, UA PRESENT   Lipase, blood     Status: None   Collection Time: 12/15/19  9:41 PM  Result Value Ref Range   Lipase 25 11 - 51 U/L  Comprehensive metabolic panel     Status: Abnormal   Collection Time: 12/15/19  9:41 PM  Result Value Ref Range   Sodium 137 135 - 145 mmol/L   Potassium 4.4 3.5 - 5.1 mmol/L   Chloride 108 98 - 111 mmol/L   CO2 16 (L) 22 - 32 mmol/L   Glucose, Bld 239 (H) 70 - 99 mg/dL   BUN 20 6 - 20 mg/dL   Creatinine, Ser 0.89 0.61 - 1.24 mg/dL   Calcium 9.2 8.9 - 10.3 mg/dL   Total Protein 7.6 6.5 - 8.1  g/dL   Albumin 3.6 3.5 - 5.0 g/dL   AST 17 15 - 41 U/L   ALT 30 0 - 44 U/L   Alkaline Phosphatase 92 38 - 126 U/L   Total Bilirubin 0.6 0.3 - 1.2 mg/dL   GFR calc non Af Amer >60 >60 mL/min   GFR calc Af Amer >60 >60 mL/min   Anion gap 13 5 - 15  CBC     Status: Abnormal   Collection Time: 12/15/19  9:41 PM  Result Value Ref Range   WBC 17.6 (H) 4.0 - 10.5 K/uL   RBC 6.71 (H) 4.22 - 5.81 MIL/uL   Hemoglobin 19.4 (H) 13.0 - 17.0 g/dL   HCT 56.4 (H) 39.0 - 52.0 %   MCV 84.1 80.0 - 100.0 fL   MCH 28.9 26.0 - 34.0 pg   MCHC 34.4 30.0 - 36.0 g/dL   RDW 12.1 11.5 - 15.5 %   Platelets 394 150 - 400 K/uL   nRBC 0.0 0.0 - 0.2 %  Lactic acid, plasma     Status: None   Collection Time: 12/15/19 11:20 PM  Result Value Ref Range   Lactic Acid, Venous 1.8 0.5 - 1.9 mmol/L  APTT     Status: None   Collection Time: 12/15/19 11:30 PM  Result Value Ref Range   aPTT 31 24 - 36 seconds  Protime-INR      Status: None   Collection Time: 12/15/19 11:30 PM  Result Value Ref Range   Prothrombin Time 13.9 11.4 - 15.2 seconds   INR 1.1 0.8 - 1.2  Blood Culture (routine x 2)     Status: None (Preliminary result)   Collection Time: 12/15/19 11:30 PM   Specimen: BLOOD  Result Value Ref Range   Specimen Description BLOOD LEFT ANTECUBITAL    Special Requests      BOTTLES DRAWN AEROBIC AND ANAEROBIC Blood Culture adequate volume   Culture      NO GROWTH < 12 HOURS Performed at [facility], [address], [place], [place] [address]    Report Status PENDING   Blood Culture (routine x 2)     Status: None (Preliminary result)   Collection Time: 12/15/19 11:30 PM   Specimen: BLOOD LEFT HAND  Result Value Ref Range   Specimen Description BLOOD LEFT HAND    Special Requests      BOTTLES DRAWN AEROBIC AND ANAEROBIC Blood Culture adequate volume   Culture      NO GROWTH < 12 HOURS Performed at [facility], [place] [address] [place], [place] [address]    Report Status PENDING   SARS CORONAVIRUS 2 (TAT 6-24 HRS) Nasopharyngeal Nasopharyngeal Swab     Status: None   Collection Time: 12/16/19  2:09 AM   Specimen: Nasopharyngeal Swab  Result Value Ref Range   SARS Coronavirus 2 NEGATIVE NEGATIVE  C difficile quick scan w PCR reflex     Status: None   Collection Time: 12/16/19  2:56 AM   Specimen: Stool  Result Value Ref Range   C Diff antigen NEGATIVE NEGATIVE   C Diff toxin NEGATIVE NEGATIVE   C Diff interpretation No C. difficile detected.   HIV Antibody (routine testing w rflx)     Status: None   Collection Time: 12/16/19  3:40 AM  Result Value Ref Range   HIV Screen 4th Generation wRfx NON REACTIVE NON REACTIVE  Sedimentation rate     Status: None   Collection Time: 12/16/19  3:40 AM  Result Value Ref Range   Sed Rate 5 0 - 16 mm/hr  C-reactive protein     Status: Abnormal   Collection Time: 12/16/19  3:40 AM  Result Value Ref Range   CRP 1.5 (H) <1.0 mg/dL  CBC      Status: Abnormal   Collection Time: 12/16/19  3:40 AM  Result Value Ref Range   WBC 19.1 (H) 4.0 - 10.5 K/uL   RBC 6.38 (H) 4.22 - 5.81 MIL/uL   Hemoglobin 18.8 (H) 13.0 - 17.0 g/dL   HCT 54.6 (H) 39.0 - 52.0 %   MCV 85.6 80.0 - 100.0 fL   MCH 29.5 26.0 - 34.0 pg   MCHC 34.4 30.0 - 36.0 g/dL   RDW 12.2 11.5 - 15.5 %   Platelets 358 150 - 400 K/uL   nRBC 0.0 0.0 - 0.2 %  Troponin I (High Sensitivity)     Status: None   Collection Time: 12/16/19  3:40 AM  Result Value Ref Range   Troponin I (High Sensitivity) 7 <18 ng/L  Hemoglobin A1c     Status: Abnormal   Collection Time: 12/16/19  3:40 AM  Result Value Ref Range   Hgb A1c MFr Bld 8.8 (H) 4.8 - 5.6 %   Mean Plasma Glucose 205.86 mg/dL  CBG monitoring, ED     Status: Abnormal   Collection Time: 12/16/19  3:53 AM  Result Value Ref Range   Glucose-Capillary 251 (H) 70 - 99 mg/dL  Glucose, capillary     Status: Abnormal   Collection Time: 12/16/19  7:52 AM  Result Value Ref Range   Glucose-Capillary 196 (H) 70 - 99 mg/dL  Glucose, capillary     Status: Abnormal   Collection Time: 12/16/19 12:12 PM  Result Value Ref Range   Glucose-Capillary 160 (H) 70 - 99 mg/dL     CT ABDOMEN PELVIS W CONTRAST  Result Date: 12/16/2019 CLINICAL DATA:  Right lower quadrant abdominal pain, cramping, nausea and vomiting EXAM: CT ABDOMEN AND PELVIS WITH CONTRAST TECHNIQUE: Multidetector CT imaging of the abdomen and pelvis was performed using the standard protocol following bolus administration of intravenous contrast. CONTRAST:  129m OMNIPAQUE IOHEXOL 300 MG/ML  SOLN COMPARISON:  Comparison CT 12/22/2014 FINDINGS: Lower chest: Lung bases are clear. Normal heart size. No pericardial effusion. Hepatobiliary: Diffuse hepatic hypoattenuation compatible with hepatic steatosis. No focal liver abnormality is seen. No gallstones, gallbladder wall thickening, or biliary dilatation. Pancreas: Unremarkable. No pancreatic ductal dilatation or surrounding  inflammatory changes. Spleen: Normal in size without focal abnormality. Adrenals/Urinary Tract: Adrenal glands are unremarkable. Kidneys are normal, without renal calculi, focal lesion, or hydronephrosis. Bladder is unremarkable. Stomach/Bowel: Distal esophagus, stomach and duodenal sweep are unremarkable. No small bowel wall thickening or dilatation. No evidence of obstruction. Normal appendix in the right lower quadrant (6/68) question some mild pancolonic edematous mural thickening and faint mucosal hyperemia. Vascular/Lymphatic: The aorta is normal caliber. No pathologically enlarged lymph nodes in the abdomen or pelvis. Major venous structures are unremarkable. Reproductive: The prostate and seminal vesicles are unremarkable. Other: No abdominopelvic free fluid or free gas. No bowel containing hernias. Musculoskeletal: No acute osseous abnormality or suspicious osseous lesion. Multilevel Schmorl's node formations in the vertebral bodies are similar to comparison study. IMPRESSION: 1. Pancolonic edematous changes could reflect an acute infectious or inflammatory pancolitis. 2. Normal appendix. 3. Hepatic steatosis. Electronically Signed   By: PLovena LeM.D.   On: 12/16/2019 00:40   [facility]  Result Date: 12/15/2019 CLINICAL DATA:  Right upper quadrant pain EXAM: PORTABLE CHEST 1 VIEW COMPARISON:  08/04/2015 FINDINGS: Borderline cardiomegaly. No focal opacity or pleural effusion. No pneumothorax. IMPRESSION: No active disease.  Borderline cardiomegaly Electronically Signed   By: Donavan Foil M.D.   On: 12/15/2019 23:32    ROS:  As stated above in the HPI otherwise negative.  Blood pressure (!) 144/89, pulse 89, temperature 98 F (36.7 C), temperature source Oral, resp. rate 18, height 6' 3"  (1.905 m), weight (!) 145.2 kg, SpO2 97 %.    PE: Gen: Fatigued, uncomfortable HEENT:  [place]/AT, EOMI Neck: Supple, no LAD Lungs: CTA Bilaterally CV: RRR without M/G/R ABM: Soft, diffuse  tenderness with mild palpation, no distension., +BS Ext: No C/C/E  Assessment/Plan: 1) Mild pancolitis. 2) Diarrhea. 3) ABM pain. 4) Nausea/vomiting.   The GI pathogen panel is still pending.  The current findings will benefit with a FFS, if the GI pathogen panel is negative.  Plan: 1) FFS tomorrow with Dr. Collene Mares. 2) Follow up on the GI pathogen panel. Starsky Nanna D 12/16/2019, 4:29 PM

## 2019-12-16 NOTE — Plan of Care (Signed)

## 2019-12-16 NOTE — Plan of Care (Signed)
  Problem: Education: Goal: Knowledge of General Education information will improve Description: Including pain rating scale, medication(s)/side effects and non-pharmacologic comfort measures Outcome: Progressing   Problem: Health Behavior/Discharge Planning: Goal: Ability to manage health-related needs will improve Outcome: Progressing   Problem: Clinical Measurements: Goal: Will remain free from infection Outcome: Progressing Goal: Diagnostic test results will improve Outcome: Progressing Goal: Respiratory complications will improve Outcome: Progressing Goal: Cardiovascular complication will be avoided Outcome: Progressing   Problem: Activity: Goal: Risk for activity intolerance will decrease Outcome: Progressing   Problem: Nutrition: Goal: Adequate nutrition will be maintained Outcome: Progressing   Problem: Coping: Goal: Level of anxiety will decrease Outcome: Progressing   Problem: Elimination: Goal: Will not experience complications related to urinary retention Outcome: Progressing   Problem: Pain Managment: Goal: General experience of comfort will improve Outcome: Progressing   Problem: Safety: Goal: Ability to remain free from injury will improve Outcome: Progressing   Problem: Skin Integrity: Goal: Risk for impaired skin integrity will decrease Outcome: Progressing

## 2019-12-16 NOTE — H&P (Signed)
History and Physical    Patrick Munoz MQK:[id number] DOB: 1986-05-24 DOA: 12/15/2019  PCP: Wenda Low, MD Patient coming from: Abdominal pain  Chief Complaint: Abdominal pain, vomiting, diarrhea  HPI: Patrick Munoz is a 34 y.o. male with medical history significant of non-insulin-dependent type 2 diabetes, hypertension presenting to the ED with complaints of abdominal pain, vomiting, and diarrhea.  Patient reports 3 days history of severe right upper quadrant abdominal pain.  Also having nonbloody, nonbilious emesis and nonbloody diarrhea.  Diarrhea is yellow in color and described as "oily."  He has not been able to tolerate p.o. intake.  Denies recent sick contacts.  No fevers or chills.  Denies history of IBD.  No cough, shortness of breath, or chest pain.  ED Course: Afebrile.  Tachycardic with heart rate up to 120s on arrival.  Blood pressure elevated with systolic up to 657X.  Labs showing WBC count 17.6.  Lactic acid level normal.  Hemoglobin 19.4.  Bicarb 16, anion gap 13.  Blood glucose 239.  Lipase and LFTs normal.  UA not suggestive of infection.  Urine culture pending.  Blood culture x2 pending.  SARS-CoV-2 PCR test pending.  Chest x-ray showing borderline cardiomegaly and no active disease.  CT abdomen pelvis showing findings concerning for acute infectious or inflammatory pancolitis.  Patient received Dilaudid, Zofran, ceftriaxone, metronidazole, and 2 L normal saline boluses.  Review of Systems:  All systems reviewed and apart from history of presenting illness, are negative.  Past Medical History:  Diagnosis Date  . Back pain   . Diabetes mellitus without complication ([place])   . Hypertension     History reviewed. No pertinent surgical history.   reports that he has never smoked. His smokeless tobacco use includes snuff. He reports current alcohol use. He reports that he does not use drugs.  Allergies: Penicillins  Family History  Problem Relation Age of Onset   . Colon cancer Maternal Grandfather   . Stomach cancer Maternal Grandfather     Prior to Admission medications   Medication Sig Start Date End Date Taking? Authorizing Provider  albuterol (VENTOLIN HFA) 108 (90 Base) MCG/ACT inhaler Inhale 2 puffs into the lungs every 4 (four) hours as needed. 10/28/19  Yes [provider]  glimepiride (AMARYL) 2 MG tablet Take 2 mg by mouth every morning. 11/25/19  Yes [provider]  lisinopril (ZESTRIL) 5 MG tablet Take 5 mg by mouth daily. 12/10/19  Yes [provider]  pregabalin (LYRICA) 100 MG capsule Take 100 mg by mouth 2 (two) times daily. 11/25/19  Yes [provider]  TRULICITY 1.5 UX/8.3FX SOPN Inject 1.5 mg into the skin once a week. Tuesdays 11/28/19  Yes [provider]  ibuprofen (ADVIL,MOTRIN) 800 MG tablet Take 1 tablet (800 mg total) by mouth 3 (three) times daily. Patient not taking: Reported on 12/15/2019 08/04/15   Fransico Meadow, PA-C  pantoprazole (PROTONIX) 20 MG tablet Take 1 tablet (20 mg total) by mouth 2 (two) times daily. Patient not taking: Reported on 12/15/2019 12/22/14   Orpah Greek, MD    Physical Exam: Vitals:   12/16/19 0045 12/16/19 0115 12/16/19 0156 12/16/19 0230  BP: (!) 168/118 (!) 171/128 (!) 146/115 (!) 160/108  Pulse: 100 (!) 107 (!) 111 96  Resp: 20 18 14 13   Temp:      TempSrc:      SpO2: 97% (!) 89% 97% 97%  Weight:      Height:        Physical  Exam  Constitutional: He is oriented to person, place, and time. He appears well-developed and well-nourished. No distress.  HENT:  Head: Normocephalic.  Eyes: Right eye exhibits no discharge. Left eye exhibits no discharge.  Cardiovascular: Normal rate, regular rhythm and intact distal pulses.  Pulmonary/Chest: Effort normal and breath sounds normal. No respiratory distress. He has no wheezes. He has no rales.  Abdominal: Soft. Bowel sounds are normal. He exhibits no distension. There is abdominal tenderness.  There is no rebound and no guarding.  Right upper quadrant mildly tender to palpation  Musculoskeletal:        General: No edema.     Cervical back: Neck supple.  Neurological: He is alert and oriented to person, place, and time.  Skin: Skin is warm and dry. He is not diaphoretic.     Labs on Admission: I have personally reviewed following labs and imaging studies  CBC: Recent Labs  Lab 12/15/19 2141  WBC 17.6*  HGB 19.4*  HCT 56.4*  MCV 84.1  PLT 161   Basic Metabolic Panel: Recent Labs  Lab 12/15/19 2141  NA 137  K 4.4  CL 108  CO2 16*  GLUCOSE 239*  BUN 20  CREATININE 0.89  CALCIUM 9.2   GFR: Estimated Creatinine Clearance: 183.4 mL/min (by C-G formula based on SCr of 0.89 mg/dL). Liver Function Tests: Recent Labs  Lab 12/15/19 2141  AST 17  ALT 30  ALKPHOS 92  BILITOT 0.6  PROT 7.6  ALBUMIN 3.6   Recent Labs  Lab 12/15/19 2141  LIPASE 25   No results for input(s): AMMONIA in the last 168 hours. Coagulation Profile: Recent Labs  Lab 12/15/19 2330  INR 1.1   Cardiac Enzymes: No results for input(s): CKTOTAL, CKMB, CKMBINDEX, TROPONINI in the last 168 hours. BNP (last 3 results) No results for input(s): PROBNP in the last 8760 hours. HbA1C: No results for input(s): HGBA1C in the last 72 hours. CBG: No results for input(s): GLUCAP in the last 168 hours. Lipid Profile: No results for input(s): CHOL, HDL, LDLCALC, TRIG, CHOLHDL, LDLDIRECT in the last 72 hours. Thyroid Function Tests: No results for input(s): TSH, T4TOTAL, FREET4, T3FREE, THYROIDAB in the last 72 hours. Anemia Panel: No results for input(s): VITAMINB12, FOLATE, FERRITIN, TIBC, IRON, RETICCTPCT in the last 72 hours. Urine analysis:    Component Value Date/Time   COLORURINE AMBER (A) 12/15/2019 2140   APPEARANCEUR HAZY (A) 12/15/2019 2140   LABSPEC 1.033 (H) 12/15/2019 2140   PHURINE 5.0 12/15/2019 2140   GLUCOSEU 150 (A) 12/15/2019 2140   HGBUR NEGATIVE 12/15/2019 2140    BILIRUBINUR NEGATIVE 12/15/2019 2140   KETONESUR NEGATIVE 12/15/2019 2140   PROTEINUR 30 (A) 12/15/2019 2140   UROBILINOGEN 0.2 12/22/2014 1753   NITRITE NEGATIVE 12/15/2019 2140   LEUKOCYTESUR NEGATIVE 12/15/2019 2140    Radiological Exams on Admission: CT ABDOMEN PELVIS W CONTRAST  Result Date: 12/16/2019 CLINICAL DATA:  Right lower quadrant abdominal pain, cramping, nausea and vomiting EXAM: CT ABDOMEN AND PELVIS WITH CONTRAST TECHNIQUE: Multidetector CT imaging of the abdomen and pelvis was performed using the standard protocol following bolus administration of intravenous contrast. CONTRAST:  159m OMNIPAQUE IOHEXOL 300 MG/ML  SOLN COMPARISON:  Comparison CT 12/22/2014 FINDINGS: Lower chest: Lung bases are clear. Normal heart size. No pericardial effusion. Hepatobiliary: Diffuse hepatic hypoattenuation compatible with hepatic steatosis. No focal liver abnormality is seen. No gallstones, gallbladder wall thickening, or biliary dilatation. Pancreas: Unremarkable. No pancreatic ductal dilatation or surrounding inflammatory changes. Spleen: Normal in size without  focal abnormality. Adrenals/Urinary Tract: Adrenal glands are unremarkable. Kidneys are normal, without renal calculi, focal lesion, or hydronephrosis. Bladder is unremarkable. Stomach/Bowel: Distal esophagus, stomach and duodenal sweep are unremarkable. No small bowel wall thickening or dilatation. No evidence of obstruction. Normal appendix in the right lower quadrant (6/68) question some mild pancolonic edematous mural thickening and faint mucosal hyperemia. Vascular/Lymphatic: The aorta is normal caliber. No pathologically enlarged lymph nodes in the abdomen or pelvis. Major venous structures are unremarkable. Reproductive: The prostate and seminal vesicles are unremarkable. Other: No abdominopelvic free fluid or free gas. No bowel containing hernias. Musculoskeletal: No acute osseous abnormality or suspicious osseous lesion. Multilevel  Schmorl's node formations in the vertebral bodies are similar to comparison study. IMPRESSION: 1. Pancolonic edematous changes could reflect an acute infectious or inflammatory pancolitis. 2. Normal appendix. 3. Hepatic steatosis. Electronically Signed   By: Lovena Le M.D.   On: 12/16/2019 00:40   DG Chest Port 1 View  Result Date: 12/15/2019 CLINICAL DATA:  Right upper quadrant pain EXAM: PORTABLE CHEST 1 VIEW COMPARISON:  08/04/2015 FINDINGS: Borderline cardiomegaly. No focal opacity or pleural effusion. No pneumothorax. IMPRESSION: No active disease.  Borderline cardiomegaly Electronically Signed   By: Donavan Foil M.D.   On: 12/15/2019 23:32    EKG: Independently reviewed.  Sinus tachycardia, heart rate 134.  New T wave abnormality in inferior leads.  Assessment/Plan Principal Problem:   Pancolitis (HCC) Active Problems:   SIRS (systemic inflammatory response syndrome) (HCC)   Polycythemia   Type 2 diabetes mellitus (HCC)   HTN (hypertension)   SIRS secondary to acute infectious versus inflammatory pancolitis: Continue ceftriaxone and metronidazole.  Continue to trend WBC count.  Blood culture x2 pending.  Check C. difficile PCR and GI pathogen panel.  Check ESR and CRP levels.  Keep n.p.o. at this time and continue IV fluid hydration.  Morphine as needed for pain.  Antiemetic as needed.  Consult GI in a.m.  Polycythemia: Hemoglobin 19.4. Platelet count also increased from baseline, likely due to hemoconcentration from dehydration.  Continue IV fluid hydration and repeat CBC in a.m.  Normal anion gap metabolic acidosis: Suspect related to diarrhea.  Continue IV fluid hydration and repeat BMP in a.m.  Abnormal EKG: EKG showing new T wave abnormality in inferior leads.  Patient denies chest pain.  Continue cardiac monitoring and check high-sensitivity troponin level.  Hyperglycemia in the setting of non-insulin-dependent type 2 diabetes: Blood glucose 239 on initial labs.  Check A1c.   Sliding scale insulin q4 hrs and CBG checks.  Hypertension: Blood pressure elevated on arrival, currently improved.  Continue home lisinopril.  Hydralazine as needed for SBP greater than 170.  DVT prophylaxis: Lovenox Code Status: Full code Family Communication: No family available at this time. Disposition Plan: Anticipate discharge after clinical improvement. Consults called: None Admission status: It is my clinical opinion that admission to INPATIENT is reasonable and necessary in this 34 y.o. male . presenting with severe abdominal pain, nausea, vomiting, and diarrhea secondary to acute infectious versus inflammatory pancolitis.  Unable to tolerate p.o. intake.  Needs IV fluid hydration, analgesic, antiemetic, and antibiotics.  Work-up mentioned above.  Given the aforementioned, the predictability of an adverse outcome is felt to be significant. I expect that the patient will require at least 2 midnights in the hospital to treat this condition.   The medical decision making on this patient was of high complexity and the patient is at high risk for clinical deterioration, therefore this is a level 3  visit.   Shela Leff MD [facility]  If 7PM-7AM, please contact night-coverage [email or web address] Password [email or web address]  12/16/2019, 3:01 AM

## 2019-12-16 NOTE — Progress Notes (Signed)
Patient seen and examined, admitted earlier this morning by Dr. Marlowe Sax briefly Mr. Brandow is a 34 year old obese male with type 2 diabetes mellitus, hypertension presented to the ED with diffuse abdominal pain nausea vomiting and diarrhea, his symptoms started last week, briefly got a little better and then got worse again few days ago, he denies any sick contacts, no fevers or chills -Work-up in the ED noted leukocytosis, hyperglycemia and CT abdomen pelvis which was concerning for pancolitis  1.  Pancolitis -Likely infectious, cannot rule out inflammatory disorder at this time -C. difficile PCR is negative -Follow-up GI pathogen panel -Continue IV fluids, start clear liquids, supportive care -Continue IV ceftriaxone and Flagyl -Gastroenterology consulted, discussed with Dr. Benson Norway  2.  Type 2 diabetes mellitus -Oral hypoglycemics on hold, sliding scale insulin  3.  Polycythemia -Likely from dehydration, hemoconcentration, monitor with hydration  Rest as noted by Dr. Marlowe Sax today  Domenic Polite, MD

## 2019-12-17 ENCOUNTER — Inpatient Hospital Stay (HOSPITAL_COMMUNITY): Payer: BC Managed Care – PPO | Admitting: Certified Registered"

## 2019-12-17 ENCOUNTER — Encounter (HOSPITAL_COMMUNITY)
Admission: EM | Disposition: A | Discharge status: Home / Self Care | Payer: Self-pay | Source: Home / Self Care | Attending: Internal Medicine

## 2019-12-17 ENCOUNTER — Encounter (HOSPITAL_COMMUNITY): Payer: Self-pay | Admitting: Internal Medicine

## 2019-12-17 DIAGNOSIS — D751 Secondary polycythemia: Secondary | ICD-10-CM

## 2019-12-17 DIAGNOSIS — E119 Type 2 diabetes mellitus without complications: Secondary | ICD-10-CM

## 2019-12-17 DIAGNOSIS — I10 Essential (primary) hypertension: Secondary | ICD-10-CM

## 2019-12-17 HISTORY — PX: POLYPECTOMY: SHX5525

## 2019-12-17 HISTORY — PX: BIOPSY: SHX5522

## 2019-12-17 HISTORY — PX: COLONOSCOPY: SHX5424

## 2019-12-17 LAB — COMPREHENSIVE METABOLIC PANEL
ALT: 22 U/L (ref 0–44)
AST: 14 U/L — ABNORMAL LOW (ref 15–41)
Albumin: 2.8 g/dL — ABNORMAL LOW (ref 3.5–5.0)
Alkaline Phosphatase: 64 U/L (ref 38–126)
Anion gap: 9 (ref 5–15)
BUN: 14 mg/dL (ref 6–20)
CO2: 22 mmol/L (ref 22–32)
Calcium: 8.3 mg/dL — ABNORMAL LOW (ref 8.9–10.3)
Chloride: 106 mmol/L (ref 98–111)
Creatinine, Ser: 1.01 mg/dL (ref 0.61–1.24)
GFR calc Af Amer: >60 mL/min (ref >60)
GFR calc non Af Amer: >60 mL/min (ref >60)
Glucose, Bld: 202 mg/dL — ABNORMAL HIGH (ref 70–99)
Potassium: 3.9 mmol/L (ref 3.5–5.1)
Sodium: 137 mmol/L (ref 135–145)
Total Bilirubin: 0.3 mg/dL (ref 0.3–1.2)
Total Protein: 5.7 g/dL — ABNORMAL LOW (ref 6.5–8.1)

## 2019-12-17 LAB — URINE CULTURE: Culture: NO GROWTH

## 2019-12-17 LAB — CBC
HCT: 49.1 % (ref 39.0–52.0)
Hemoglobin: 16.9 g/dL (ref 13.0–17.0)
MCH: 29.1 pg (ref 26.0–34.0)
MCHC: 34.4 g/dL (ref 30.0–36.0)
MCV: 84.7 fL (ref 80.0–100.0)
Platelets: 292 10*3/uL (ref 150–400)
RBC: 5.8 MIL/uL (ref 4.22–5.81)
RDW: 12.3 % (ref 11.5–15.5)
WBC: 14.5 10*3/uL — ABNORMAL HIGH (ref 4.0–10.5)
nRBC: 0 % (ref 0.0–0.2)

## 2019-12-17 LAB — GLUCOSE, CAPILLARY
Glucose-Capillary: 181 mg/dL — ABNORMAL HIGH (ref 70–99)
Glucose-Capillary: 180 mg/dL — ABNORMAL HIGH (ref 70–99)
Glucose-Capillary: 129 mg/dL — ABNORMAL HIGH (ref 70–99)
Glucose-Capillary: 172 mg/dL — ABNORMAL HIGH (ref 70–99)
Glucose-Capillary: 181 mg/dL — ABNORMAL HIGH (ref 70–99)
Glucose-Capillary: 194 mg/dL — ABNORMAL HIGH (ref 70–99)

## 2019-12-17 SURGERY — POLYPECTOMY
Anesthesia: Monitor Anesthesia Care

## 2019-12-17 MED ORDER — PROPOFOL 500 MG/50ML IV EMUL
INTRAVENOUS | Status: DC | PRN
  Administered 2019-12-17: 125 ug/kg/min via INTRAVENOUS

## 2019-12-17 MED ORDER — LACTATED RINGERS IV SOLN: INTRAVENOUS | Status: DC

## 2019-12-17 MED ORDER — PROPOFOL 10 MG/ML IV BOLUS
INTRAVENOUS | Status: DC | PRN
  Administered 2019-12-17: 40 mg via INTRAVENOUS
  Administered 2019-12-17 (×2): 20 mg via INTRAVENOUS

## 2019-12-17 MED ORDER — LIDOCAINE 2% (20 MG/ML) 5 ML SYRINGE
INTRAMUSCULAR | Status: DC | PRN
  Administered 2019-12-17: 60 mg via INTRAVENOUS

## 2019-12-17 NOTE — Transfer of Care (Signed)
Immediate Anesthesia Transfer of Care Note  Patient: Patrick Munoz  Procedure(s) Performed: POLYPECTOMY BIOPSY COLONOSCOPY (N/A )  Patient Location: Endoscopy Unit  Anesthesia Type:MAC  Level of Consciousness: awake, alert  and oriented  Airway & Oxygen Therapy: Patient Spontanous Breathing  Post-op Assessment: Report given to RN  Post vital signs: Reviewed and stable  Last Vitals:  Vitals Value Taken Time  BP    Temp    Pulse    Resp    SpO2      Last Pain:  Vitals:   12/17/19 1350  TempSrc: Temporal  PainSc: 0-No pain      Patients Stated Pain Goal: 2 (90/21/11 5520)  Complications: No apparent anesthesia complications

## 2019-12-17 NOTE — Progress Notes (Signed)
PROGRESS NOTE    Patrick Munoz  LKG:[id number] DOB: Feb 17, 1986 DOA: 12/15/2019 PCP: Wenda Low, MD    Brief Narrative:  Patient was admitted to the hospital with a working diagnosis of pancolitis.  34 year old male who presented with abdominal pain, vomiting and diarrhea. He does have significant past medical history for hypertension and type 2 diabetes mellitus. He reported 3-day history of severe right upper quadrant abdominal pain, associated with nonbloody nonbilious emesis and nonbloody diarrhea. On his initial physical examination blood pressure 168/128, heart rate 107, respirate 18, oxygen saturation 97%, his lungs are clear to auscultation bilaterally, heart S1-S2 present rhythm, abdomen was soft, positive tender to palpation at the right upper quadrant, no rebound or guarding, no lower extremity edema.   CT of the abdomen with pancolonic edematous changes, consistent with pancolitis  Assessment & Plan:   Principal Problem:   Pancolitis ([place]) Active Problems:   SIRS (systemic inflammatory response syndrome) (HCC)   Polycythemia   Type 2 diabetes mellitus (HCC)   HTN (hypertension)   1. Colitis. Patient continue to have diarrhea, sp endoscopy today, with signs of colitis. WBC is down to 14 from 19.   Will continue clear liquid diet and antibiotic therapy ceftriaxone and metronidazole.   2. T2DM. Will continue insulin sliding scale for glucose cover and monitoring.   3. Polycythemia. (reactive to dehydration) Continue close follow up of cell count, will dc IV fluids. Hct down to 49 from 54 with IV fluids.   4. HTN. Continue blood pressure control with lisinopril.   DVT prophylaxis: enoxaparin   Code Status:  Full. Family Communication: I spoke with patient's girl friend at the bedside, we talked in detail about patient's condition, plan of care and prognosis and all questions were addressed.  Disposition Plan/ discharge barriers: patient acutely ill    Nutrition  Status:           Skin Documentation:     Consultants:   GI   Procedures:   flexsi  Antimicrobials:   Ceftriaxone and metronidaozole     Subjective: Patient continue to have diarrhea, with no nausea or vomiting, no chest pain. No dyspnea.   Objective: Vitals:   12/16/19 0900 12/16/19 1429 12/16/19 1957 12/17/19 0419  BP: (!) 158/123 (!) 144/89 (!) 141/98 125/75  Pulse:  89 90 66  Resp:  18 17 16   Temp:  98 F (36.7 C) 98.5 F (36.9 C) 97.8 F (36.6 C)  TempSrc:  Oral Oral Oral  SpO2:  97% 94% 96%  Weight:      Height:        Intake/Output Summary (Last 24 hours) at 12/17/2019 1236 Last data filed at 12/16/2019 1600 Gross per 24 hour  Intake 540.23 ml  Output --  Net 540.23 ml   Filed Weights   12/15/19 2133  Weight: (!) 145.2 kg    Examination:   General: Not in pain or dyspnea.  Neurology: Awake and alert, non focal  E ENT: no pallor, no icterus, oral mucosa moist Cardiovascular: No JVD. S1-S2 present, rhythmic, no gallops, rubs, or murmurs. No lower extremity edema. Pulmonary: positive breath sounds bilaterally, adequate air movement, no wheezing, rhonchi or rales. Gastrointestinal. Abdomen protuberant with no organomegaly, non tender, no rebound or guarding Skin. No rashes Musculoskeletal: no joint deformities     Data Reviewed: I have personally reviewed following labs and imaging studies  CBC: Recent Labs  Lab 12/15/19 2141 12/16/19 0340 12/17/19 0133  WBC 17.6* 19.1* 14.5*  HGB 19.4* 18.8* 16.9  HCT 56.4* 54.6* 49.1  MCV 84.1 85.6 84.7  PLT 394 358 431   Basic Metabolic Panel: Recent Labs  Lab 12/15/19 2141 12/17/19 0133  NA 137 137  K 4.4 3.9  CL 108 106  CO2 16* 22  GLUCOSE 239* 202*  BUN 20 14  CREATININE 0.89 1.01  CALCIUM 9.2 8.3*   GFR: Estimated Creatinine Clearance: 161.6 mL/min (by C-G formula based on SCr of 1.01 mg/dL). Liver Function Tests: Recent Labs  Lab 12/15/19 2141 12/17/19 0133  AST 17 14*   ALT 30 22  ALKPHOS 92 64  BILITOT 0.6 0.3  PROT 7.6 5.7*  ALBUMIN 3.6 2.8*   Recent Labs  Lab 12/15/19 2141  LIPASE 25   No results for input(s): AMMONIA in the last 168 hours. Coagulation Profile: Recent Labs  Lab 12/15/19 2330  INR 1.1   Cardiac Enzymes: No results for input(s): CKTOTAL, CKMB, CKMBINDEX, TROPONINI in the last 168 hours. BNP (last 3 results) No results for input(s): PROBNP in the last 8760 hours. HbA1C: Recent Labs    12/16/19 0340  HGBA1C 8.8*   CBG: Recent Labs  Lab 12/16/19 1634 12/16/19 1954 12/16/19 2336 12/17/19 0417 12/17/19 0752  GLUCAP 227* 221* 182* 181* 180*   Lipid Profile: No results for input(s): CHOL, HDL, LDLCALC, TRIG, CHOLHDL, LDLDIRECT in the last 72 hours. Thyroid Function Tests: No results for input(s): TSH, T4TOTAL, FREET4, T3FREE, THYROIDAB in the last 72 hours. Anemia Panel: No results for input(s): VITAMINB12, FOLATE, FERRITIN, TIBC, IRON, RETICCTPCT in the last 72 hours.    Radiology Studies: I have reviewed all of the imaging during this hospital visit personally     Scheduled Meds: . enoxaparin (LOVENOX) injection  70 mg Subcutaneous Q24H  . insulin aspart  0-9 Units Subcutaneous Q4H  . lisinopril  5 mg Oral Daily  . nicotine  14 mg Transdermal Daily  . sodium chloride flush  3 mL Intravenous Once   Continuous Infusions: . sodium chloride 75 mL/hr at 12/17/19 0406  . sodium chloride    . cefTRIAXone (ROCEPHIN)  IV 2 g (12/16/19 2348)  . metronidazole 500 mg (12/17/19 0835)     LOS: 1 day        Dacari Beckstrand Gerome Apley, MD

## 2019-12-17 NOTE — Op Note (Signed)
[facility] Patient Name: Patrick Munoz Procedure Date : 12/17/2019 MRN: [id number] Attending MD: Juanita Craver , MD Date of Birth: Apr 29, 1986 CSN: [id number] Age: 34 Admit Type: Inpatient Procedure:                Colonoscopy with biopsies & snare polypectomy x 1. Indications:              Change in bowel habits; Abnormal CT scan-?Colitis. Providers:                Juanita Craver, MD, Glori Bickers, RN, Lazaro Arms,                            Technician, Sampson Si, CRNA Referring MD:             [facility] Medicines:                Monitored Anesthesia Care Complications:            No immediate complications. Estimated Blood Loss:     Estimated blood loss was minimal. Procedure:                Pre-Anesthesia Assessment: - Prior to the                            procedure, a history and physical was performed,                            and patient medications and allergies were                            reviewed. The patient's tolerance of previous                            anesthesia was also reviewed. The risks and                            benefits of the procedure and the sedation options                            and risks were discussed with the patient. All                            questions were answered, and informed consent was                            obtained. Prior Anticoagulants: The patient has                            taken no previous anticoagulant or antiplatelet                            agents. ASA Grade Assessment: II - A patient with                            mild systemic disease. After reviewing the risks  and benefits, the patient was deemed in                            satisfactory condition to undergo the procedure.                            After obtaining informed consent, the colonoscope                            was passed under direct vision. Throughout the                            procedure, the  patient's blood pressure, pulse, and                            oxygen saturations were monitored continuously. The                            CF-HQ190L ([id number]) Olympus colonoscope was                            introduced through the anus and advanced to the the                            terminal ileum, with identification of the                            appendiceal orifice and IC valve. After obtaining                            informed consent, the colonoscope was passed under                            direct vision. Throughout the procedure, the                            patient's blood pressure, pulse, and oxygen                            saturations were monitored continuously.The                            colonoscopy was performed with moderate difficulty                            due to inadequate bowel prep. Successful completion                            of the procedure was aided by lavage. The patient                            tolerated the procedure well. The quality of the  bowel preparation was adequate. The terminal ileum,                            the ileocecal valve, the appendiceal orifice and                            the rectum were photographed. There was no bowel                            prep used. Scope In: 3:15:45 PM Scope Out: 3:33:40 PM Scope Withdrawal Time: 0 hours 14 minutes 4 seconds  Total Procedure Duration: 0 hours 17 minutes 55 seconds  Findings:      A 7-8 mm sessile polyp was found in the distal sigmoid colon; the polyp       was removed with a cold snare x 1; resection and retrieval were complete.      A small sessile polyp was found in the distal sigmoid colon; this was       removed by cold biopsies.      There were lack of vascular markings noted throughout the colon; random       biopsies were done to rule out microscopic colitis.      The terminal ileum appeared normal.      No additional abnormalities  were found on retroflexion. Impression:               - One 7-8 mm sessile polyp in the distal sigmoid                            colon, removed with a cold snare x 1; resected and                            retrieved.                           - One small sessile polyp in the distal sigmoid                            colon-removed by cold biopsy x 1.                           - Diffuse loss of vascular markings noted                            throughout the colon-random biopsies done to rule                            out microscopic colitis                           - The examined portion of the ileum was normal. Moderate Sedation:      MAC used. Recommendation:           - Full liquid diet today; advance as tolerated.                           - Continue present  medications.                           - Await pathology results.                           - Repeat colonoscopy in 7-10 years for surveillance.                           - Return to GI office in 2 weeks.                           - If the patient has any abnormal GI symptoms in                            the interim, he has been advised to call the office                            ASAP for further recommendations. Procedure Code(s):        --- Professional ---                           [phone], Colonoscopy, flexible; with removal of                            tumor(s), polyp(s), or other lesion(s) by snare                            technique                           45380, 10, Colonoscopy, flexible; with biopsy,                            single or multiple Diagnosis Code(s):        --- Professional ---                           R19.4, Change in bowel habit                           R93.3, Abnormal findings on diagnostic imaging of                            other parts of digestive tract                           D12.5, Benign neoplasm of sigmoid colon                           Z12.11, Encounter for screening for  malignant                            neoplasm of colon CPT copyright 2019 [employer]. All rights reserved. The codes documented in this report are preliminary and upon coder review may  be revised to  meet current compliance requirements. Juanita Craver, MD Juanita Craver, MD 12/17/2019 3:49:46 PM This report has been signed electronically. Number of Addenda: 0

## 2019-12-17 NOTE — Progress Notes (Signed)
Inpatient Diabetes Program Recommendations  AACE/ADA: New Consensus Statement on Inpatient Glycemic Control (2015)  Target Ranges:  Prepandial:   less than 140 mg/dL      Peak postprandial:   less than 180 mg/dL (1-2 hours)      Critically ill patients:  140 - 180 mg/dL   Lab Results  Component Value Date   GLUCAP 180 (H) 12/17/2019   HGBA1C 8.8 (H) 12/16/2019    Review of Glycemic Control Results for Patrick Munoz, Patrick Munoz (MRN [id number]) as of 12/17/2019 11:18  Ref. Range 12/16/2019 16:34 12/16/2019 19:54 12/16/2019 23:36 12/17/2019 04:17 12/17/2019 07:52  Glucose-Capillary Latest Ref Range: 70 - 99 mg/dL 227 (H) 221 (H) 182 (H) 181 (H) 180 (H)   Diabetes history: DM 2 Outpatient Diabetes medications:  Amaryl 2 mg daily, Trulicity 1.5 mg weekly Current orders for Inpatient glycemic control:  Novolog sensitive q 4 hours Inpatient Diabetes Program Recommendations:   May consider adding Lantus 12 units daily for basal insulin needs.    Thanks  Adah Perl, RN, BC-ADM Inpatient Diabetes Coordinator Pager [phone] (8a-5p)

## 2019-12-17 NOTE — Anesthesia Postprocedure Evaluation (Signed)
Anesthesia Post Note  Patient: [name]  Procedure(s) Performed: POLYPECTOMY BIOPSY COLONOSCOPY (N/A )     Patient location during evaluation: PACU Anesthesia Type: MAC Level of consciousness: awake and alert Pain management: pain level controlled Vital Signs Assessment: post-procedure vital signs reviewed and stable Respiratory status: spontaneous breathing, nonlabored ventilation, respiratory function stable and patient connected to nasal cannula oxygen Cardiovascular status: stable and blood pressure returned to baseline Postop Assessment: no apparent nausea or vomiting Anesthetic complications: no    Last Vitals:  Vitals:   12/17/19 1540 12/17/19 1551  BP: 102/60 111/65  Pulse:    Resp: 12 17  Temp: 36.6 C   SpO2: 97% 97%    Last Pain:  Vitals:   12/17/19 1551  TempSrc:   PainSc: 0-No pain                 [name] DAVID

## 2019-12-17 NOTE — Anesthesia Preprocedure Evaluation (Signed)
Anesthesia Evaluation  Patient identified by MRN, date of birth, ID band Patient awake    Reviewed: Allergy & Precautions, NPO status , Patient's Chart, lab work & pertinent test results  Airway Mallampati: I  TM Distance: >3 FB Neck ROM: Full    Dental   Pulmonary    Pulmonary exam normal        Cardiovascular hypertension, Pt. on medications Normal cardiovascular exam     Neuro/Psych    GI/Hepatic   Endo/Other  diabetes, Type 2, Oral Hypoglycemic Agents  Renal/GU      Musculoskeletal   Abdominal   Peds  Hematology   Anesthesia Other Findings   Reproductive/Obstetrics                             Anesthesia Physical Anesthesia Plan  ASA: II  Anesthesia Plan: MAC   Post-op Pain Management:    Induction: Intravenous  PONV Risk Score and Plan: 1 and Treatment may vary due to age or medical condition  Airway Management Planned: Nasal Cannula  Additional Equipment:   Intra-op Plan:   Post-operative Plan:   Informed Consent: I have reviewed the patients History and Physical, chart, labs and discussed the procedure including the risks, benefits and alternatives for the proposed anesthesia with the patient or authorized representative who has indicated his/her understanding and acceptance.       Plan Discussed with: CRNA and Surgeon  Anesthesia Plan Comments:         Anesthesia Quick Evaluation

## 2019-12-17 NOTE — Anesthesia Procedure Notes (Signed)
Procedure Name: MAC Date/Time: 12/17/2019 3:08 PM Performed by: Barrington Ellison, CRNA Pre-anesthesia Checklist: Patient identified, Emergency Drugs available, Suction available and Patient being monitored Patient Re-evaluated:Patient Re-evaluated prior to induction Oxygen Delivery Method: Nasal cannula

## 2019-12-18 DIAGNOSIS — R6510 Systemic inflammatory response syndrome (SIRS) of non-infectious origin without acute organ dysfunction: Secondary | ICD-10-CM

## 2019-12-18 LAB — GLUCOSE, CAPILLARY
Glucose-Capillary: 160 mg/dL — ABNORMAL HIGH (ref 70–99)
Glucose-Capillary: 165 mg/dL — ABNORMAL HIGH (ref 70–99)
Glucose-Capillary: 163 mg/dL — ABNORMAL HIGH (ref 70–99)
Glucose-Capillary: 195 mg/dL — ABNORMAL HIGH (ref 70–99)
Glucose-Capillary: 253 mg/dL — ABNORMAL HIGH (ref 70–99)
Glucose-Capillary: 218 mg/dL — ABNORMAL HIGH (ref 70–99)

## 2019-12-18 LAB — GI PATHOGEN PANEL BY PCR, STOOL

## 2019-12-18 LAB — BASIC METABOLIC PANEL
Anion gap: 11 (ref 5–15)
BUN: 11 mg/dL (ref 6–20)
CO2: 24 mmol/L (ref 22–32)
Calcium: 8.7 mg/dL — ABNORMAL LOW (ref 8.9–10.3)
Chloride: 106 mmol/L (ref 98–111)
Creatinine, Ser: 1.09 mg/dL (ref 0.61–1.24)
GFR calc Af Amer: >60 mL/min (ref >60)
GFR calc non Af Amer: >60 mL/min (ref >60)
Glucose, Bld: 188 mg/dL — ABNORMAL HIGH (ref 70–99)
Potassium: 3.2 mmol/L — ABNORMAL LOW (ref 3.5–5.1)
Sodium: 141 mmol/L (ref 135–145)

## 2019-12-18 MED ORDER — METRONIDAZOLE 500 MG PO TABS
500.0000 mg | ORAL_TABLET | Freq: Three times a day (TID) | ORAL | Status: DC
  Administered 2019-12-18 – 2019-12-21 (×9): 500 mg via ORAL
  Filled 2019-12-18 (×9): qty 1

## 2019-12-18 MED ORDER — INFLUENZA VAC A&B SA ADJ QUAD 0.5 ML IM PRSY
0.5000 mL | PREFILLED_SYRINGE | INTRAMUSCULAR | Status: DC
  Filled 2019-12-18: qty 0.5

## 2019-12-18 MED ORDER — INSULIN ASPART 100 UNIT/ML ~~LOC~~ SOLN
0.0000 [IU] | Freq: Three times a day (TID) | SUBCUTANEOUS | Status: DC
  Administered 2019-12-19: 10:00:00 3 [IU] via SUBCUTANEOUS
  Administered 2019-12-19: 5 [IU] via SUBCUTANEOUS
  Administered 2019-12-20: 8 [IU] via SUBCUTANEOUS
  Administered 2019-12-20 – 2019-12-21 (×4): 3 [IU] via SUBCUTANEOUS

## 2019-12-18 MED ORDER — INSULIN ASPART 100 UNIT/ML ~~LOC~~ SOLN
0.0000 [IU] | Freq: Every day | SUBCUTANEOUS | Status: DC
  Administered 2019-12-18: 21:00:00 2 [IU] via SUBCUTANEOUS
  Administered 2019-12-20: 3 [IU] via SUBCUTANEOUS

## 2019-12-18 NOTE — Plan of Care (Signed)
  Problem: Activity: Goal: Risk for activity intolerance will decrease Outcome: Progressing   Problem: Elimination: Goal: Will not experience complications related to bowel motility Outcome: Progressing Goal: Will not experience complications related to urinary retention Outcome: Progressing   Problem: Safety: Goal: Ability to remain free from injury will improve Outcome: Progressing   Problem: Skin Integrity: Goal: Risk for impaired skin integrity will decrease Outcome: Progressing   

## 2019-12-18 NOTE — Progress Notes (Signed)
PROGRESS NOTE    Patrick Munoz  TWS:[id number] DOB: Aug 03, 1986 DOA: 12/15/2019 PCP: Wenda Low, MD    Brief Narrative:  Patient was admitted to the hospital with a working diagnosis of pancolitis.  34 year old male who presented with abdominal pain, vomiting and diarrhea. He does have significant past medical history for hypertension and type 2 diabetes mellitus. He reported 3-day history of severe right upper quadrant abdominal pain, associated with nonbloody nonbilious emesis and nonbloody diarrhea. On his initial physical examination blood pressure 168/128, heart rate 107, respirate 18, oxygen saturation 97%, his lungs are clear to auscultation bilaterally, heart S1-S2 present rhythm, abdomen was soft, positive tender to palpation at the right upper quadrant, no rebound or guarding, no lower extremity edema.   CT of the abdomen with pancolonic edematous changes, consistent with pancolitis  Patient has been placed on IV antibiotic therapy and IV fluids for supportive medical care. Further work up with colonoscopy, diffuse loss of vascular markings throughout the colon. 2 polyps were removed.  Today diet has been advanced to regular.    Assessment & Plan:   Principal Problem:   Pancolitis ([place]) Active Problems:   SIRS (systemic inflammatory response syndrome) (HCC)   Polycythemia   Type 2 diabetes mellitus (HCC)   HTN (hypertension)    1. Colitis. Persistent abdominal pain and diarrhea. No nausea or vomiting, diet has been advanced today. Wbc has been trending down. C diff has been negative.   Will continue ceftriaxone IV and transition to oral metronidazole. Follow on cell count in am and colonoscopy biopsies.   Plan to dc home when diarrhea improves.   2. Uncontrolled T2DM (Hgb A1 c at 8,8). Capillary glucose 165 to 163, will continue with insulin sliding scale for glucose cover and monitoring. Diet has been advanced to regular.   3. Polycythemia. (reactive to  dehydration) Patient now off IV fluids, will follow on renal panel daily.   4. HTN. Blood pressure control with lisinopril.   5. Obesity. Calculated BMI is 40. Will need outpatient follow up.   DVT prophylaxis: enoxaparin   Code Status:  Full. Family Communication: no family at the bedside. Disposition Plan/ discharge barriers: patient acutely ill    Consultants:    GI   Procedures:   Colonoscopy 02/24  Antimicrobials:   Ceftriaxone and metronidazole.     Subjective: Patient continue to have lower abdominal pain, dull in nature, no improving or worsening factors and associated with watery stools. One episode of bowel incontinence.   Objective: Vitals:   12/17/19 1540 12/17/19 1551 12/17/19 2038 12/18/19 0510  BP: 102/60 111/65 (!) 159/105 136/85  Pulse:   92 72  Resp: 12 17 17 17   Temp: 97.8 F (36.6 C)  (!) 97.2 F (36.2 C) 98.2 F (36.8 C)  TempSrc: Temporal   Oral  SpO2: 97% 97% 98% 98%  Weight:      Height:        Intake/Output Summary (Last 24 hours) at 12/18/2019 1151 Last data filed at 12/18/2019 0810 Gross per 24 hour  Intake 1530 ml  Output --  Net 1530 ml   Filed Weights   12/15/19 2133  Weight: (!) 145.2 kg    Examination:   General: Not in pain or dyspnea, deconditioned  Neurology: Awake and alert, non focal  E ENT: no pallor, no icterus, oral mucosa moist Cardiovascular: No JVD. S1-S2 present, rhythmic, no gallops, rubs, or murmurs. No lower extremity edema. Pulmonary: positive breath sounds bilaterally, adequate air movement, no wheezing, rhonchi  or rales. Gastrointestinal. Abdomen mild distended and tender to deep palpation at the lower quadrants, with no organomegaly, no rebound or guarding Skin. No rashes Musculoskeletal: no joint deformities     Data Reviewed: I have personally reviewed following labs and imaging studies  CBC: Recent Labs  Lab 12/15/19 2141 12/16/19 0340 12/17/19 0133  WBC 17.6* 19.1* 14.5*  HGB 19.4*  18.8* 16.9  HCT 56.4* 54.6* 49.1  MCV 84.1 85.6 84.7  PLT 394 358 073   Basic Metabolic Panel: Recent Labs  Lab 12/15/19 2141 12/17/19 0133  NA 137 137  K 4.4 3.9  CL 108 106  CO2 16* 22  GLUCOSE 239* 202*  BUN 20 14  CREATININE 0.89 1.01  CALCIUM 9.2 8.3*   GFR: Estimated Creatinine Clearance: 161.6 mL/min (by C-G formula based on SCr of 1.01 mg/dL). Liver Function Tests: Recent Labs  Lab 12/15/19 2141 12/17/19 0133  AST 17 14*  ALT 30 22  ALKPHOS 92 64  BILITOT 0.6 0.3  PROT 7.6 5.7*  ALBUMIN 3.6 2.8*   Recent Labs  Lab 12/15/19 2141  LIPASE 25   No results for input(s): AMMONIA in the last 168 hours. Coagulation Profile: Recent Labs  Lab 12/15/19 2330  INR 1.1   Cardiac Enzymes: No results for input(s): CKTOTAL, CKMB, CKMBINDEX, TROPONINI in the last 168 hours. BNP (last 3 results) No results for input(s): PROBNP in the last 8760 hours. HbA1C: Recent Labs    12/16/19 0340  HGBA1C 8.8*   CBG: Recent Labs  Lab 12/17/19 1945 12/17/19 2311 12/18/19 0349 12/18/19 0741 12/18/19 1126  GLUCAP 181* 194* 160* 165* 163*   Lipid Profile: No results for input(s): CHOL, HDL, LDLCALC, TRIG, CHOLHDL, LDLDIRECT in the last 72 hours. Thyroid Function Tests: No results for input(s): TSH, T4TOTAL, FREET4, T3FREE, THYROIDAB in the last 72 hours. Anemia Panel: No results for input(s): VITAMINB12, FOLATE, FERRITIN, TIBC, IRON, RETICCTPCT in the last 72 hours.    Radiology Studies: I have reviewed all of the imaging during this hospital visit personally     Scheduled Meds: . enoxaparin (LOVENOX) injection  70 mg Subcutaneous Q24H  . [START ON 12/19/2019] influenza vaccine adjuvanted  0.5 mL Intramuscular Tomorrow-1000  . insulin aspart  0-9 Units Subcutaneous Q4H  . lisinopril  5 mg Oral Daily  . metroNIDAZOLE  500 mg Oral Q8H  . nicotine  14 mg Transdermal Daily  . sodium chloride flush  3 mL Intravenous Once   Continuous Infusions: . cefTRIAXone  (ROCEPHIN)  IV 2 g (12/17/19 2316)     LOS: 2 days        Keyante Durio Gerome Apley, MD

## 2019-12-18 NOTE — Progress Notes (Signed)
Subjective: He reports feeling better.  Objective: Vital signs in last 24 hours: Temp:  [97.2 F (36.2 C)-98.2 F (36.8 C)] 98.2 F (36.8 C) (02/25 0510) Pulse Rate:  [72-92] 72 (02/25 0510) Resp:  [12-20] 17 (02/25 0510) BP: (102-160)/(60-105) 136/85 (02/25 0510) SpO2:  [97 %-100 %] 98 % (02/25 0510) Last BM Date: 12/16/19  Intake/Output from previous day: 02/24 0701 - 02/25 0700 In: 1050 [P.O.:750; I.V.:300] Out: -  Intake/Output this shift: No intake/output data recorded.  General appearance: no distress and aroused from sleep GI: tender diffusely with light touch  Lab Results: Recent Labs    12/15/19 2141 12/16/19 0340 12/17/19 0133  WBC 17.6* 19.1* 14.5*  HGB 19.4* 18.8* 16.9  HCT 56.4* 54.6* 49.1  PLT 394 358 292   BMET Recent Labs    12/15/19 2141 12/17/19 0133  NA 137 137  K 4.4 3.9  CL 108 106  CO2 16* 22  GLUCOSE 239* 202*  BUN 20 14  CREATININE 0.89 1.01  CALCIUM 9.2 8.3*   LFT Recent Labs    12/17/19 0133  PROT 5.7*  ALBUMIN 2.8*  AST 14*  ALT 22  ALKPHOS 64  BILITOT 0.3   PT/INR Recent Labs    12/15/19 2330  LABPROT 13.9  INR 1.1   Hepatitis Panel No results for input(s): HEPBSAG, HCVAB, HEPAIGM, HEPBIGM in the last 72 hours. C-Diff Recent Labs    12/16/19 0256  CDIFFTOX NEGATIVE   Fecal Lactopherrin No results for input(s): FECLLACTOFRN in the last 72 hours.  Studies/Results: No results found.  Medications:  Scheduled: . enoxaparin (LOVENOX) injection  70 mg Subcutaneous Q24H  . insulin aspart  0-9 Units Subcutaneous Q4H  . lisinopril  5 mg Oral Daily  . nicotine  14 mg Transdermal Daily  . sodium chloride flush  3 mL Intravenous Once   Continuous: . cefTRIAXone (ROCEPHIN)  IV 2 g (12/17/19 2316)  . metronidazole 500 mg (12/18/19 0115)    Assessment/Plan: 1) Pancolitis on the CT scan. 2) Persistent diarrhea and abdominal pain.   He states that his diarrhea persists, but it is improved.  This is the same  with his abdominal pain, but he states that it is still very intense.  The gross appearance of the colon with the colonoscopy yesterday was rather benign.  Biopsies are pending.  He desires to eat a regular diet.  Plan: 1) Continue with antibiotics for now. 2) Okay to advance diet.  LOS: 2 days   Sueann Brownley D 12/18/2019, 6:46 AM

## 2019-12-19 LAB — BASIC METABOLIC PANEL
Anion gap: 10 (ref 5–15)
BUN: 15 mg/dL (ref 6–20)
CO2: 25 mmol/L (ref 22–32)
Calcium: 8.5 mg/dL — ABNORMAL LOW (ref 8.9–10.3)
Chloride: 105 mmol/L (ref 98–111)
Creatinine, Ser: 0.9 mg/dL (ref 0.61–1.24)
GFR calc Af Amer: >60 mL/min (ref >60)
GFR calc non Af Amer: >60 mL/min (ref >60)
Glucose, Bld: 200 mg/dL — ABNORMAL HIGH (ref 70–99)
Potassium: 3.5 mmol/L (ref 3.5–5.1)
Sodium: 140 mmol/L (ref 135–145)

## 2019-12-19 LAB — CBC WITH DIFFERENTIAL/PLATELET
Abs Immature Granulocytes: 0 K/uL (ref 0.00–0.07)
Basophils Absolute: 0 K/uL (ref 0.0–0.1)
Basophils Relative: 0 %
Eosinophils Absolute: 2.9 K/uL — ABNORMAL HIGH (ref 0.0–0.5)
Eosinophils Relative: 18 %
HCT: 47.2 % (ref 39.0–52.0)
Hemoglobin: 16.5 g/dL (ref 13.0–17.0)
Lymphocytes Relative: 18 %
Lymphs Abs: 2.9 K/uL (ref 0.7–4.0)
MCH: 29.8 pg (ref 26.0–34.0)
MCHC: 35 g/dL (ref 30.0–36.0)
MCV: 85.4 fL (ref 80.0–100.0)
Monocytes Absolute: 1 K/uL (ref 0.1–1.0)
Monocytes Relative: 6 %
Neutro Abs: 9.5 K/uL — ABNORMAL HIGH (ref 1.7–7.7)
Neutrophils Relative %: 58 %
Platelets: 306 K/uL (ref 150–400)
RBC: 5.53 MIL/uL (ref 4.22–5.81)
RDW: 12.4 % (ref 11.5–15.5)
WBC: 16.3 K/uL — ABNORMAL HIGH (ref 4.0–10.5)
nRBC: 0 % (ref 0.0–0.2)
nRBC: 0 /100{WBCs}

## 2019-12-19 LAB — GLUCOSE, CAPILLARY
Glucose-Capillary: 197 mg/dL — ABNORMAL HIGH (ref 70–99)
Glucose-Capillary: 221 mg/dL — ABNORMAL HIGH (ref 70–99)
Glucose-Capillary: 118 mg/dL — ABNORMAL HIGH (ref 70–99)
Glucose-Capillary: 117 mg/dL — ABNORMAL HIGH (ref 70–99)

## 2019-12-19 MED ORDER — GLIMEPIRIDE 4 MG PO TABS
2.0000 mg | ORAL_TABLET | Freq: Every morning | ORAL | Status: DC
  Administered 2019-12-19 – 2019-12-21 (×3): 2 mg via ORAL
  Filled 2019-12-19 (×3): qty 1

## 2019-12-19 MED ORDER — PREGABALIN 100 MG PO CAPS
100.0000 mg | ORAL_CAPSULE | Freq: Two times a day (BID) | ORAL | Status: DC
  Administered 2019-12-19 – 2019-12-21 (×5): 100 mg via ORAL
  Filled 2019-12-19 (×5): qty 1

## 2019-12-19 NOTE — Progress Notes (Addendum)
PROGRESS NOTE    Patrick Munoz  JKD:[id number] DOB: 08-05-1986 DOA: 12/15/2019 PCP: Wenda Low, MD    Brief Narrative:  Patient was admitted to the hospital with a working diagnosis of pancolitis.  34 year old male who presented with abdominal pain, vomiting and diarrhea. He does have significant past medical history for hypertension and type 2 diabetes mellitus. He reported 3-day history of severe right upper quadrant abdominal pain, associated with nonbloody nonbilious emesis and nonbloody diarrhea. On his initial physical examination blood pressure 168/128, heart rate 107, respirate 18, oxygen saturation 97%, his lungs are clear to auscultation bilaterally, heart S1-S2 present rhythm, abdomen was soft, positive tender to palpation at the right upper quadrant, no rebound or guarding, no lower extremity edema.  CT of the abdomen with pancolonic edematous changes, consistent with pancolitis  Patient has been placed on IV antibiotic therapy and IV fluids for supportive medical care. Further work up with colonoscopy, diffuse loss of vascular markings throughout the colon. 2 polyps were removed.  Diet has been advanced to regular, patient continue to have diarrhea and abdominal pain.      Assessment & Plan:   Principal Problem:   Pancolitis ([place]) Active Problems:   SIRS (systemic inflammatory response syndrome) (HCC)   Polycythemia   Type 2 diabetes mellitus (HCC)   HTN (hypertension)   Obesity, Class III, BMI 40-49.9 (morbid obesity) ([place])    1. Acute infectious colitis. Continue to have abdominal pain and diarrhea (watery > 5 times per day). Wbc has stable at 16, tolerating po diet. Stool panel negative including C diff.   Continue supportive care with ceftriaxone IV and oral metronidazole.   His diarrhea and abdominal pain need to improve before discharge.   2. Uncontrolled T2DM (Hgb A1 c at 8,8) complicated with neuropathy. Capillary glucose 197 and 221.  Resume home dose of glimepiride and will continue with insulin sliding scale for glucose cover and monitoring.   Resume pregabalin 100 mg bid.   3. Polycythemia. (reactive to dehydration). Resolved.    4. HTN. Continue with lisinopril for blood pressure control.   5. Obesity. Calculated BMI is 40. Out of bed to chair as tolerated, ambulate in the hallway.   DVT prophylaxis:enoxaparin Code Status:Full. Family Communication:I spoke with patient's girlfirend at the bedside, we talked in detail about patient's condition, plan of care and prognosis and all questions were addressed.  Disposition Plan/ discharge barriers:plan to dc home when diarrhea and abdominal pain improves/    Consultants:   GI   Procedures:   Colonoscopy and polypectomy   Antimicrobials:   Ceftriaxone and metronidazole     Subjective: Patient continue to have right lower quadrant abdominal pain associated with diarrhea, watery, so far 5 times this am, no nausea or vomiting and tolerating po well.   Objective: Vitals:   12/18/19 0510 12/18/19 1500 12/18/19 1951 12/19/19 0542  BP: 136/85 (!) 147/97 (!) 149/91 120/81  Pulse: 72 84 69 (!) 57  Resp: 17 18 17 17   Temp: 98.2 F (36.8 C) (!) 97.5 F (36.4 C) 98.2 F (36.8 C) 97.7 F (36.5 C)  TempSrc: Oral Oral Oral Oral  SpO2: 98% 94% 97% 96%  Weight:      Height:        Intake/Output Summary (Last 24 hours) at 12/19/2019 1226 Last data filed at 12/19/2019 0900 Gross per 24 hour  Intake 2010 ml  Output --  Net 2010 ml   Filed Weights   12/15/19 2133  Weight: (!) 145.2 kg  Examination:   General: Not in pain or dyspnea, deconditioned  Neurology: Awake and alert, non focal  E ENT: no pallor, no icterus, oral mucosa moist Cardiovascular: No JVD. S1-S2 present, rhythmic, no gallops, rubs, or murmurs. No lower extremity edema. Pulmonary: vesicular breath sounds bilaterally, adequate air movement, no wheezing, rhonchi or  rales. Gastrointestinal. Abdomen distended, protuberant with mild tenderness to deep palpation at the right lower quadrant, no organomegaly,  no rebound or guarding Skin. No rashes Musculoskeletal: no joint deformities     Data Reviewed: I have personally reviewed following labs and imaging studies  CBC: Recent Labs  Lab 12/15/19 2141 12/16/19 0340 12/17/19 0133 12/19/19 0852  WBC 17.6* 19.1* 14.5* 16.3*  NEUTROABS  --   --   --  9.5*  HGB 19.4* 18.8* 16.9 16.5  HCT 56.4* 54.6* 49.1 47.2  MCV 84.1 85.6 84.7 85.4  PLT 394 358 292 888   Basic Metabolic Panel: Recent Labs  Lab 12/15/19 2141 12/17/19 0133 12/18/19 1426 12/19/19 0852  NA 137 137 141 140  K 4.4 3.9 3.2* 3.5  CL 108 106 106 105  CO2 16* 22 24 25   GLUCOSE 239* 202* 188* 200*  BUN 20 14 11 15   CREATININE 0.89 1.01 1.09 0.90  CALCIUM 9.2 8.3* 8.7* 8.5*   GFR: Estimated Creatinine Clearance: 181.3 mL/min (by C-G formula based on SCr of 0.9 mg/dL). Liver Function Tests: Recent Labs  Lab 12/15/19 2141 12/17/19 0133  AST 17 14*  ALT 30 22  ALKPHOS 92 64  BILITOT 0.6 0.3  PROT 7.6 5.7*  ALBUMIN 3.6 2.8*   Recent Labs  Lab 12/15/19 2141  LIPASE 25   No results for input(s): AMMONIA in the last 168 hours. Coagulation Profile: Recent Labs  Lab 12/15/19 2330  INR 1.1   Cardiac Enzymes: No results for input(s): CKTOTAL, CKMB, CKMBINDEX, TROPONINI in the last 168 hours. BNP (last 3 results) No results for input(s): PROBNP in the last 8760 hours. HbA1C: No results for input(s): HGBA1C in the last 72 hours. CBG: Recent Labs  Lab 12/18/19 1549 12/18/19 1947 12/18/19 2125 12/19/19 0736 12/19/19 1155  GLUCAP 195* 253* 218* 197* 221*   Lipid Profile: No results for input(s): CHOL, HDL, LDLCALC, TRIG, CHOLHDL, LDLDIRECT in the last 72 hours. Thyroid Function Tests: No results for input(s): TSH, T4TOTAL, FREET4, T3FREE, THYROIDAB in the last 72 hours. Anemia Panel: No results for input(s):  VITAMINB12, FOLATE, FERRITIN, TIBC, IRON, RETICCTPCT in the last 72 hours.    Radiology Studies: I have reviewed all of the imaging during this hospital visit personally     Scheduled Meds: . enoxaparin (LOVENOX) injection  70 mg Subcutaneous Q24H  . influenza vaccine adjuvanted  0.5 mL Intramuscular Tomorrow-1000  . insulin aspart  0-15 Units Subcutaneous TID WC  . insulin aspart  0-5 Units Subcutaneous QHS  . lisinopril  5 mg Oral Daily  . metroNIDAZOLE  500 mg Oral Q8H  . nicotine  14 mg Transdermal Daily  . sodium chloride flush  3 mL Intravenous Once   Continuous Infusions: . cefTRIAXone (ROCEPHIN)  IV 2 g (12/19/19 0021)     LOS: 3 days        Damien Cisar Gerome Apley, MD

## 2019-12-19 NOTE — Progress Notes (Signed)
Subjective: No change with his pain or diarrhea.  Objective: Vital signs in last 24 hours: Temp:  [97.5 F (36.4 C)-98.2 F (36.8 C)] 97.7 F (36.5 C) (02/26 0542) Pulse Rate:  [57-84] 57 (02/26 0542) Resp:  [17-18] 17 (02/26 0542) BP: (120-149)/(81-97) 120/81 (02/26 0542) SpO2:  [94 %-97 %] 96 % (02/26 0542) Last BM Date: 12/18/19  Intake/Output from previous day: 02/25 0701 - 02/26 0700 In: 1970 [P.O.:1870; IV Piggyback:100] Out: -  Intake/Output this shift: Total I/O In: 340 [P.O.:240; IV Piggyback:100] Out: -   General appearance: arousable from sleep, mumbles answers to questions GI: mild diffuse tenderness  Lab Results: Recent Labs    12/17/19 0133  WBC 14.5*  HGB 16.9  HCT 49.1  PLT 292   BMET Recent Labs    12/17/19 0133 12/18/19 1426  NA 137 141  K 3.9 3.2*  CL 106 106  CO2 22 24  GLUCOSE 202* 188*  BUN 14 11  CREATININE 1.01 1.09  CALCIUM 8.3* 8.7*   LFT Recent Labs    12/17/19 0133  PROT 5.7*  ALBUMIN 2.8*  AST 14*  ALT 22  ALKPHOS 64  BILITOT 0.3   PT/INR No results for input(s): LABPROT, INR in the last 72 hours. Hepatitis Panel No results for input(s): HEPBSAG, HCVAB, HEPAIGM, HEPBIGM in the last 72 hours. C-Diff No results for input(s): CDIFFTOX in the last 72 hours. Fecal Lactopherrin No results for input(s): FECLLACTOFRN in the last 72 hours.  Studies/Results: No results found.  Medications:  Scheduled: . enoxaparin (LOVENOX) injection  70 mg Subcutaneous Q24H  . influenza vaccine adjuvanted  0.5 mL Intramuscular Tomorrow-1000  . insulin aspart  0-15 Units Subcutaneous TID WC  . insulin aspart  0-5 Units Subcutaneous QHS  . lisinopril  5 mg Oral Daily  . metroNIDAZOLE  500 mg Oral Q8H  . nicotine  14 mg Transdermal Daily  . sodium chloride flush  3 mL Intravenous Once   Continuous: . cefTRIAXone (ROCEPHIN)  IV 2 g (12/19/19 0021)    Assessment/Plan: 1) Diarrhea. 2) ABM pain.   I am unable to obtain an  accurate assessment of his pain.  Understandably he is not well, but since the first time I have met him he maintains an irritated demeanor.  It is difficult to obtain answers and he rolls over to find a better position to sleep.  The GI pathogen panel is pending.  No new CBC since 12/17/2019.  The colon biopsies are still pending.  Plan: 1) Continue with supportive care. 2) Await biopsy results.  LOS: 3 days   Charell Faulk D 12/19/2019, 6:49 AM

## 2019-12-19 NOTE — Progress Notes (Signed)
Inpatient Diabetes Program Recommendations  AACE/ADA: New Consensus Statement on Inpatient Glycemic Control (2015)  Target Ranges:  Prepandial:   less than 140 mg/dL      Peak postprandial:   less than 180 mg/dL (1-2 hours)      Critically ill patients:  140 - 180 mg/dL   Lab Results  Component Value Date   GLUCAP 197 (H) 12/19/2019   HGBA1C 8.8 (H) 12/16/2019    Review of Glycemic Control Results for Patrick Munoz, Patrick Munoz (MRN [id number]) as of 12/19/2019 10:18  Ref. Range 12/18/2019 19:47 12/18/2019 21:25 12/19/2019 07:36  Glucose-Capillary Latest Ref Range: 70 - 99 mg/dL 253 (H) 218 (H) 197 (H)   Diabetes history: DM 2 Outpatient Diabetes medications:  Amaryl 2 mg daily, Trulicity 1.5 mg weekly Current orders for Inpatient glycemic control:  Novolog moderate TID & HS  Inpatient Diabetes Program Recommendations:    If to remain inpatient, consider adding Lantus 12 units daily for basal insulin needs and switching diet to carb modified.   Thanks, Bronson Curb, MSN, RNC-OB Diabetes Coordinator [phone] (8a-5p)

## 2019-12-20 ENCOUNTER — Other Ambulatory Visit: Payer: Self-pay

## 2019-12-20 DIAGNOSIS — Z860100 Personal history of colon polyps, unspecified: Secondary | ICD-10-CM

## 2019-12-20 DIAGNOSIS — K5100 Ulcerative (chronic) pancolitis without complications: Secondary | ICD-10-CM

## 2019-12-20 LAB — CBC WITH DIFFERENTIAL/PLATELET
Abs Immature Granulocytes: 0.08 10*3/uL — ABNORMAL HIGH (ref 0.00–0.07)
Basophils Absolute: 0.1 10*3/uL (ref 0.0–0.1)
Basophils Relative: 1 %
Eosinophils Absolute: 2.6 10*3/uL — ABNORMAL HIGH (ref 0.0–0.5)
Eosinophils Relative: 17 %
HCT: 46.9 % (ref 39.0–52.0)
Hemoglobin: 16.5 g/dL (ref 13.0–17.0)
Immature Granulocytes: 1 %
Lymphocytes Relative: 22 %
Lymphs Abs: 3.3 10*3/uL (ref 0.7–4.0)
MCH: 29.5 pg (ref 26.0–34.0)
MCHC: 35.2 g/dL (ref 30.0–36.0)
MCV: 83.9 fL (ref 80.0–100.0)
Monocytes Absolute: 0.9 10*3/uL (ref 0.1–1.0)
Monocytes Relative: 6 %
Neutro Abs: 8.1 10*3/uL — ABNORMAL HIGH (ref 1.7–7.7)
Neutrophils Relative %: 53 %
Platelets: 343 10*3/uL (ref 150–400)
RBC: 5.59 MIL/uL (ref 4.22–5.81)
RDW: 12.3 % (ref 11.5–15.5)
WBC: 15 10*3/uL — ABNORMAL HIGH (ref 4.0–10.5)
nRBC: 0 % (ref 0.0–0.2)

## 2019-12-20 LAB — BASIC METABOLIC PANEL
Anion gap: 11 (ref 5–15)
BUN: 13 mg/dL (ref 6–20)
CO2: 23 mmol/L (ref 22–32)
Calcium: 8.4 mg/dL — ABNORMAL LOW (ref 8.9–10.3)
Chloride: 104 mmol/L (ref 98–111)
Creatinine, Ser: 0.93 mg/dL (ref 0.61–1.24)
GFR calc Af Amer: >60 mL/min (ref >60)
GFR calc non Af Amer: >60 mL/min (ref >60)
Glucose, Bld: 247 mg/dL — ABNORMAL HIGH (ref 70–99)
Potassium: 3.6 mmol/L (ref 3.5–5.1)
Sodium: 138 mmol/L (ref 135–145)

## 2019-12-20 LAB — GLUCOSE, CAPILLARY
Glucose-Capillary: 157 mg/dL — ABNORMAL HIGH (ref 70–99)
Glucose-Capillary: 274 mg/dL — ABNORMAL HIGH (ref 70–99)
Glucose-Capillary: 192 mg/dL — ABNORMAL HIGH (ref 70–99)
Glucose-Capillary: 276 mg/dL — ABNORMAL HIGH (ref 70–99)

## 2019-12-20 LAB — CULTURE, BLOOD (ROUTINE X 2)
Culture: NO GROWTH
Culture: NO GROWTH
Special Requests: ADEQUATE
Special Requests: ADEQUATE

## 2019-12-20 MED ORDER — POTASSIUM CHLORIDE CRYS ER 20 MEQ PO TBCR
40.0000 meq | EXTENDED_RELEASE_TABLET | Freq: Once | ORAL | Status: AC
  Administered 2019-12-20: 40 meq via ORAL
  Filled 2019-12-20: qty 2

## 2019-12-20 NOTE — Progress Notes (Signed)
PROGRESS NOTE    Patrick Munoz  MVH:[id number] DOB: 03-27-1986 DOA: 12/15/2019 PCP: Wenda Low, MD    Brief Narrative:  Patient was admitted to the hospital with a working diagnosis of pancolitis.  34 year old male who presented with abdominal pain, vomiting and diarrhea. He does have significant past medical history for hypertension and type 2 diabetes mellitus. He reported 3-day history of severe right upper quadrant abdominal pain, associated with nonbloody nonbilious emesis and nonbloody diarrhea. On his initial physical examination blood pressure 168/128, heart rate 107, respirate 18, oxygen saturation 97%, his lungs are clear to auscultation bilaterally, heart S1-S2 present rhythm, abdomen was soft, positive tender to palpation at the right upper quadrant, no rebound or guarding, no lower extremity edema.  CT of the abdomen with pancolonic edematous changes, consistent with pancolitis.  Stool panel negative including C diff.   Patient has been placed on IV antibiotic therapy and IV fluids for supportive medical care. Further work up with colonoscopy, diffuse loss of vascular markings throughout the colon. 2 polyps were removed.  Diet has been advanced to regular, patient continue to have diarrhea and abdominal pain.    Assessment & Plan:   Principal Problem:   Pancolitis ([place]) Active Problems:   SIRS (systemic inflammatory response syndrome) (HCC)   Polycythemia   Type 2 diabetes mellitus (HCC)   HTN (hypertension)   Obesity, Class III, BMI 40-49.9 (morbid obesity) ([place])   1. Acute infectious colitis.Abdominal pain anddiarrhea clinically improving but not yet back to baseline, his wbc down to 15 from 16. No nausea or vomiting, tolerating po well, regular diet.   On ceftriaxone IV and oral metronidazole, plan to transition to oral in am. For possible dc home.    Still pending pathology from colonoscopy.   2.UncontrolledT2DM(Hgb A1 c at 8,8) complicated  with neuropathy.Capillary glucose 157 to 274. Continue with glimepiride and insulin sliding scale for glucose cover and monitoring.  Continue with pregabalin 100 mg bid.   3. Polycythemia. (reactive to dehydration). Resolved.   4. HTN.Onlisinopril for blood pressure control.   5. Obesity. Calculated BMI is 40. Patient has been ambulating.  DVT prophylaxis:enoxaparin Code Status:Full. Family Communication:I spoke with patient's girlfirend at the bedside, we talked in detail about patient's condition, plan of care and prognosis and all questions were addressed.   Consultants:   GI  Procedures:   Colonoscopy   Antimicrobials:   Ceftriaxone and metronidazole    Subjective: His abdominal pain has improved but not back to baseline, his stools are more formed but not yet back to normal. Abdominal pain now more localized to his left lower quadrant. He has been ambulating in the hallway.   Objective: Vitals:   12/19/19 0542 12/19/19 1443 12/19/19 2045 12/20/19 0603  BP: 120/81 (!) 146/93 (!) 144/97 (!) 140/92  Pulse: (!) 57 71 68 61  Resp: 17 18 17 17   Temp: 97.7 F (36.5 C) 98.5 F (36.9 C) 97.7 F (36.5 C) (!) 97.5 F (36.4 C)  TempSrc: Oral Oral Oral Oral  SpO2: 96% 97% 97% 99%  Weight:      Height:        Intake/Output Summary (Last 24 hours) at 12/20/2019 1217 Last data filed at 12/20/2019 1038 Gross per 24 hour  Intake 1220 ml  Output --  Net 1220 ml   Filed Weights   12/15/19 2133  Weight: (!) 145.2 kg    Examination:   General: Not in pain or dyspnea, deconditioned  Neurology: Awake and alert, non focal  E ENT: no pallor, no icterus, oral mucosa moist Cardiovascular: No JVD. S1-S2 present, rhythmic, no gallops, rubs, or murmurs. No lower extremity edema. Pulmonary: positive breath sounds bilaterally, adequate air movement, no wheezing, rhonchi or rales. Gastrointestinal. Abdomen distended, no organomegaly, mild tender at the left lower  quadrant with no rebound or guarding Skin. No rashes Musculoskeletal: no joint deformities     Data Reviewed: I have personally reviewed following labs and imaging studies  CBC: Recent Labs  Lab 12/15/19 2141 12/16/19 0340 12/17/19 0133 12/19/19 0852  WBC 17.6* 19.1* 14.5* 16.3*  NEUTROABS  --   --   --  9.5*  HGB 19.4* 18.8* 16.9 16.5  HCT 56.4* 54.6* 49.1 47.2  MCV 84.1 85.6 84.7 85.4  PLT 394 358 292 355   Basic Metabolic Panel: Recent Labs  Lab 12/15/19 2141 12/17/19 0133 12/18/19 1426 12/19/19 0852  NA 137 137 141 140  K 4.4 3.9 3.2* 3.5  CL 108 106 106 105  CO2 16* 22 24 25   GLUCOSE 239* 202* 188* 200*  BUN 20 14 11 15   CREATININE 0.89 1.01 1.09 0.90  CALCIUM 9.2 8.3* 8.7* 8.5*   GFR: Estimated Creatinine Clearance: 181.3 mL/min (by C-G formula based on SCr of 0.9 mg/dL). Liver Function Tests: Recent Labs  Lab 12/15/19 2141 12/17/19 0133  AST 17 14*  ALT 30 22  ALKPHOS 92 64  BILITOT 0.6 0.3  PROT 7.6 5.7*  ALBUMIN 3.6 2.8*   Recent Labs  Lab 12/15/19 2141  LIPASE 25   No results for input(s): AMMONIA in the last 168 hours. Coagulation Profile: Recent Labs  Lab 12/15/19 2330  INR 1.1   Cardiac Enzymes: No results for input(s): CKTOTAL, CKMB, CKMBINDEX, TROPONINI in the last 168 hours. BNP (last 3 results) No results for input(s): PROBNP in the last 8760 hours. HbA1C: No results for input(s): HGBA1C in the last 72 hours. CBG: Recent Labs  Lab 12/19/19 1155 12/19/19 1741 12/19/19 2046 12/20/19 0809 12/20/19 1203  GLUCAP 221* 118* 117* 157* 274*   Lipid Profile: No results for input(s): CHOL, HDL, LDLCALC, TRIG, CHOLHDL, LDLDIRECT in the last 72 hours. Thyroid Function Tests: No results for input(s): TSH, T4TOTAL, FREET4, T3FREE, THYROIDAB in the last 72 hours. Anemia Panel: No results for input(s): VITAMINB12, FOLATE, FERRITIN, TIBC, IRON, RETICCTPCT in the last 72 hours.    Radiology Studies: I have reviewed all of the  imaging during this hospital visit personally     Scheduled Meds: . enoxaparin (LOVENOX) injection  70 mg Subcutaneous Q24H  . glimepiride  2 mg Oral q morning - 10a  . influenza vaccine adjuvanted  0.5 mL Intramuscular Tomorrow-1000  . insulin aspart  0-15 Units Subcutaneous TID WC  . insulin aspart  0-5 Units Subcutaneous QHS  . lisinopril  5 mg Oral Daily  . metroNIDAZOLE  500 mg Oral Q8H  . nicotine  14 mg Transdermal Daily  . pregabalin  100 mg Oral BID  . sodium chloride flush  3 mL Intravenous Once   Continuous Infusions: . cefTRIAXone (ROCEPHIN)  IV Stopped (12/19/19 2349)     LOS: 4 days        Anila Bojarski Gerome Apley, MD

## 2019-12-20 NOTE — Progress Notes (Addendum)
[facility] ROUNDING NOTE   Subjective: No acute events overnight.  Tolerating full meals.  Did have abdominal pain received morphine shortly prior to my arrival.  Formed stool earlier today.  Discussed clinical status with his fiance at patient bedside.    Objective: Vital signs in last 24 hours: Temp:  [97.5 F (36.4 C)-98 F (36.7 C)] 98 F (36.7 C) (02/27 1300) Pulse Rate:  [61-87] 87 (02/27 1350) Resp:  [16-17] 16 (02/27 1300) BP: (140-171)/(90-97) 160/90 (02/27 1350) SpO2:  [97 %-100 %] 100 % (02/27 1350) Last BM Date: 12/19/19 General: NAD, resting in bed Ext:  No c/c/e    Intake/Output from previous day: 02/26 0701 - 02/27 0700 In: 1500 [P.O.:1300; IV Piggyback:200] Out: -  Intake/Output this shift: Total I/O In: 240 [P.O.:240] Out: -    Lab Results: Recent Labs    12/19/19 0852 12/20/19 1245  WBC 16.3* 15.0*  HGB 16.5 16.5  PLT 306 343  MCV 85.4 83.9   BMET Recent Labs    12/18/19 1426 12/19/19 0852 12/20/19 1245  NA 141 140 138  K 3.2* 3.5 3.6  CL 106 105 104  CO2 24 25 23   GLUCOSE 188* 200* 247*  BUN 11 15 13   CREATININE 1.09 0.90 0.93  CALCIUM 8.7* 8.5* 8.4*   LFT No results for input(s): PROT, ALBUMIN, AST, ALT, ALKPHOS, BILITOT, BILIDIR, IBILI in the last 72 hours. PT/INR No results for input(s): INR in the last 72 hours.    Imaging/Other results: No results found.    Assessment and Plan:  1) Abdominal pain 2) Colitis on CT 3) Leukocytosis  -Overall clinically improving on empiric antibiotics -GI PCR panel and C. difficile both negative -Colonoscopy completed on 12/17/2019 with 2 polyps along with decreased vascular markings throughout the colon.  Biopsies pending.  Normal TI. -Transitioning antibiotics to p.o. in anticipation of potential discharge in the morning -WBC improved to 15 today from 16 yesterday -Tolerating solid foods -Will follow-up with Dr. Collene Mares in approximately 2 weeks -GI service will sign  off at this time.  Please do not hesitate to contact on-call GI with any additional questions or concerns.    Patrick Bullion, DO  12/20/2019, 4:12 PM [facility] Pager [phone]

## 2019-12-20 NOTE — Progress Notes (Signed)
Patient's fiance brought the patient chick-fil-a for breakfast; Educated patient on sodium and glucose content of chick-fil-a and how it could exacerbate his DM and HTN; Around lunchtime the patient's B/P increased to 171/93; Hydralazine administered as ordered; Patient requested regular coca cola; Informed patient of sugar content and patient became a bit agitated and stated "I am not drinking no diet soda". Patient is noncompliant in regards to monitoring sugar and sodium intake; Will continue to monitor.

## 2019-12-21 LAB — CBC WITH DIFFERENTIAL/PLATELET
Abs Immature Granulocytes: 0.07 10*3/uL (ref 0.00–0.07)
Basophils Absolute: 0.1 10*3/uL (ref 0.0–0.1)
Basophils Relative: 1 %
Eosinophils Absolute: 2 10*3/uL — ABNORMAL HIGH (ref 0.0–0.5)
Eosinophils Relative: 14 %
HCT: 45.1 % (ref 39.0–52.0)
Hemoglobin: 15.5 g/dL (ref 13.0–17.0)
Immature Granulocytes: 1 %
Lymphocytes Relative: 28 %
Lymphs Abs: 3.9 10*3/uL (ref 0.7–4.0)
MCH: 29.5 pg (ref 26.0–34.0)
MCHC: 34.4 g/dL (ref 30.0–36.0)
MCV: 85.9 fL (ref 80.0–100.0)
Monocytes Absolute: 0.8 10*3/uL (ref 0.1–1.0)
Monocytes Relative: 6 %
Neutro Abs: 7.2 10*3/uL (ref 1.7–7.7)
Neutrophils Relative %: 50 %
Platelets: 290 10*3/uL (ref 150–400)
RBC: 5.25 MIL/uL (ref 4.22–5.81)
RDW: 12.4 % (ref 11.5–15.5)
WBC: 14 10*3/uL — ABNORMAL HIGH (ref 4.0–10.5)
nRBC: 0 % (ref 0.0–0.2)

## 2019-12-21 LAB — BASIC METABOLIC PANEL
Anion gap: 9 (ref 5–15)
BUN: 10 mg/dL (ref 6–20)
CO2: 26 mmol/L (ref 22–32)
Calcium: 8.7 mg/dL — ABNORMAL LOW (ref 8.9–10.3)
Chloride: 105 mmol/L (ref 98–111)
Creatinine, Ser: 0.75 mg/dL (ref 0.61–1.24)
GFR calc Af Amer: >60 mL/min (ref >60)
GFR calc non Af Amer: >60 mL/min (ref >60)
Glucose, Bld: 186 mg/dL — ABNORMAL HIGH (ref 70–99)
Potassium: 3.6 mmol/L (ref 3.5–5.1)
Sodium: 140 mmol/L (ref 135–145)

## 2019-12-21 LAB — GLUCOSE, CAPILLARY
Glucose-Capillary: 172 mg/dL — ABNORMAL HIGH (ref 70–99)
Glucose-Capillary: 191 mg/dL — ABNORMAL HIGH (ref 70–99)

## 2019-12-21 MED ORDER — BLOOD GLUCOSE METER KIT: PACK | 0 refills | Status: DC

## 2019-12-21 MED ORDER — CIPROFLOXACIN HCL 250 MG PO TABS: 250.0000 mg | ORAL_TABLET | Freq: Two times a day (BID) | ORAL | 0 refills | Status: AC

## 2019-12-21 MED ORDER — CIPROFLOXACIN HCL 500 MG PO TABS
250.0000 mg | ORAL_TABLET | Freq: Two times a day (BID) | ORAL | Status: DC
  Administered 2019-12-21: 250 mg via ORAL
  Filled 2019-12-21: qty 1

## 2019-12-21 NOTE — Discharge Summary (Addendum)
Physician Discharge Summary  Patrick Munoz CHE:[id number] DOB: Sep 24, 1986 DOA: 12/15/2019  PCP: Wenda Low, MD  Admit date: 12/15/2019 Discharge date: 12/21/2019  Admitted From: Home  Disposition:  Home   Recommendations for Outpatient Follow-up and new medication changes:  1. Follow up with Dr. Lysle Rubens in 7 days.  2. Follow up with Dr. Collene Mares from gastroenterology in 2 weeks 3. Patient will continue antibiotic therapy with ciprofloxacin for the next 7 days.   Home Health: no   Equipment/Devices: no    Discharge Condition: stable  CODE STATUS: full Diet recommendation: heart healthy and diabetic prudent.   Brief/Interim Summary:  Patient was admitted to the hospital with a working diagnosis of pancolitis.  34 year old male who presented with abdominal pain, vomiting and diarrhea. He does have significant past medical history for hypertension and type 2 diabetes mellitus. He reported 3-day history of severe right upper quadrant abdominal pain, associated with nonbloody nonbilious emesis and nonbloody diarrhea. On his initial physical examination blood pressure 168/128, heart rate 107, respiratory rate 18, oxygen saturation 97%, his lungs were clear to auscultation bilaterally, heart S1-S2 present rhythmic, abdomen was soft, positive tender to palpation at the right upper quadrant, no rebound or guarding, no lower extremity edema. Sodium 137, potassium 4.4, chloride 108, bicarb 16, glucose 239, BUN 20, creatinine 0.89, AST 17, ALT 30, total bilirubin 0.6, white count 17.6, hemoglobin 19.4, hematocrit 56.4, platelets 394. SARS COVID-19 negative.  Urinalysis specific gravity 1.033, 30 protein.  His chest radiograph was negative for infiltrates.  EKG 134 bpm, normal axis, normal intervals, sinus rhythm, no ST segment or T wave changes. CT of the abdomen with pancolonic edematous changes, consistent with pancolitis.  Stool panel negative includingC diff.   Patient was placed on IV  antibiotic therapy and IV fluids for supportive medical care. Further work up with colonoscopy, diffuse loss of vascular markings throughout the colon. 2 polyps were removed.  Diet was advanced to regular with good toleration, he had a slow recovery, deemed stable to go home on 02/28, follow up as outpatient.   1.  Acute infectious colitis.  Patient was admitted to the medical ward, he received supportive medical therapy with intravenous fluids, intravenous antibiotics, as needed antiemetics and analgesics.  His stool studies including C. difficile were negative.  He underwent further work-up with colonoscopy which showed diffuse loss of vascular markings, 2 polyps were removed.  Pathology still pending.  Patient symptoms slowly improved, and his diet was advanced with good toleration.  His discharge white cell count is 14.   Patient will continue taking ciprofloxacin for 7 more days, follow-up with Dr. Collene Mares from gastroenterology in 2 more weeks.  2.  Uncontrolled type 2 diabetes mellitus, hemoglobin N3I 8.8, complicated by neuropathy.  Patient received insulin therapy for glucose control, eventually transitioned to oral glimepiride per his home regimen with good toleration.  Continue pregabalin and resume Trulicity.   3.  Acute dehydration with hemoconcentration, hypokalemia and nongap metabolic acidosis/due to gastrointestinal losses.. Patient received IV fluids with good toleration, renal function remained stable, acid base disturbance resolved along with hypokalemia. Discharge K is 3,6 with serum bicarbonate at 26, creatinine 0,75 and BUN at 10.   4.  Hypertension antihypertensive agents were held during hospitalization, at discharge patient will resume lisinopril.  5.  Obesity.  His calculated BMI is 40, follow-up as an outpatient.  Discharge Diagnoses:  Principal Problem:   Pancolitis ([place]) Active Problems:   SIRS (systemic inflammatory response syndrome) (HCC)   Polycythemia  Type 2  diabetes mellitus (HCC)   HTN (hypertension)   Obesity, Class III, BMI 40-49.9 (morbid obesity) ([place])   History of colonic polyps    Discharge Instructions    Allergies as of 12/21/2019      Reactions   Penicillins Other (See Comments)   Childhood allergy.        Medication List    STOP taking these medications   ibuprofen 800 MG tablet Commonly known as: ADVIL   pantoprazole 20 MG tablet Commonly known as: PROTONIX     TAKE these medications   albuterol 108 (90 Base) MCG/ACT inhaler Commonly known as: VENTOLIN HFA Inhale 2 puffs into the lungs every 4 (four) hours as needed.   ciprofloxacin 250 MG tablet Commonly known as: CIPRO Take 1 tablet (250 mg total) by mouth 2 (two) times daily for 7 days.   glimepiride 2 MG tablet Commonly known as: AMARYL Take 2 mg by mouth every morning.   lisinopril 5 MG tablet Commonly known as: ZESTRIL Take 5 mg by mouth daily.   pregabalin 100 MG capsule Commonly known as: LYRICA Take 100 mg by mouth 2 (two) times daily.   Trulicity 1.5 GG/8.3MO Sopn Generic drug: Dulaglutide Inject 1.5 mg into the skin once a week. Tuesdays       Follow-up Information    Juanita Craver, MD Follow up in 2 week(s).   Specialty: Gastroenterology Contact information: [address], [place] [address] [phone]        Wenda Low, MD Follow up in 1 week(s).   Specialty: Internal Medicine Contact information: [address] [place] [address] [phone]           Consultations:  GI    Procedures/Studies: CT ABDOMEN PELVIS W CONTRAST  Result Date: 12/16/2019 CLINICAL DATA:  Right lower quadrant abdominal pain, cramping, nausea and vomiting EXAM: CT ABDOMEN AND PELVIS WITH CONTRAST TECHNIQUE: Multidetector CT imaging of the abdomen and pelvis was performed using the standard protocol following bolus administration of intravenous contrast. CONTRAST:  120m OMNIPAQUE IOHEXOL 300 MG/ML   SOLN COMPARISON:  Comparison CT 12/22/2014 FINDINGS: Lower chest: Lung bases are clear. Normal heart size. No pericardial effusion. Hepatobiliary: Diffuse hepatic hypoattenuation compatible with hepatic steatosis. No focal liver abnormality is seen. No gallstones, gallbladder wall thickening, or biliary dilatation. Pancreas: Unremarkable. No pancreatic ductal dilatation or surrounding inflammatory changes. Spleen: Normal in size without focal abnormality. Adrenals/Urinary Tract: Adrenal glands are unremarkable. Kidneys are normal, without renal calculi, focal lesion, or hydronephrosis. Bladder is unremarkable. Stomach/Bowel: Distal esophagus, stomach and duodenal sweep are unremarkable. No small bowel wall thickening or dilatation. No evidence of obstruction. Normal appendix in the right lower quadrant (6/68) question some mild pancolonic edematous mural thickening and faint mucosal hyperemia. Vascular/Lymphatic: The aorta is normal caliber. No pathologically enlarged lymph nodes in the abdomen or pelvis. Major venous structures are unremarkable. Reproductive: The prostate and seminal vesicles are unremarkable. Other: No abdominopelvic free fluid or free gas. No bowel containing hernias. Musculoskeletal: No acute osseous abnormality or suspicious osseous lesion. Multilevel Schmorl's node formations in the vertebral bodies are similar to comparison study. IMPRESSION: 1. Pancolonic edematous changes could reflect an acute infectious or inflammatory pancolitis. 2. Normal appendix. 3. Hepatic steatosis. Electronically Signed   By: PLovena LeM.D.   On: 12/16/2019 00:40   DG Chest Port 1 View  Result Date: 12/15/2019 CLINICAL DATA:  Right upper quadrant pain EXAM: PORTABLE CHEST 1 VIEW COMPARISON:  08/04/2015 FINDINGS: Borderline  cardiomegaly. No focal opacity or pleural effusion. No pneumothorax. IMPRESSION: No active disease.  Borderline cardiomegaly Electronically Signed   By: Donavan Foil M.D.   On:  12/15/2019 23:32      Procedures: Colonoscopy  Subjective: Patient is feeling well, abdominal pain and diarrhea are improving, patient is tolerating po well, no nausea or vomiting.  Discharge Exam: Vitals:   12/20/19 2039 12/21/19 0442  BP: 126/84 127/80  Pulse: 77 66  Resp: 18 20  Temp: 98.5 F (36.9 C) (!) 97.5 F (36.4 C)  SpO2: 98% 99%   Vitals:   12/20/19 1350 12/20/19 1852 12/20/19 2039 12/21/19 0442  BP: (!) 160/90 132/84 126/84 127/80  Pulse: 87 78 77 66  Resp:   18 20  Temp:   98.5 F (36.9 C) (!) 97.5 F (36.4 C)  TempSrc:   Oral Oral  SpO2: 100%  98% 99%  Weight:      Height:        General: Not in pain or dyspnea.  Neurology: Awake and alert, non focal  E ENT: no pallor, no icterus, oral mucosa moist Cardiovascular: No JVD. S1-S2 present, rhythmic, no gallops, rubs, or murmurs. No lower extremity edema. Pulmonary: vesicular breath sounds bilaterally, adequate air movement, no wheezing, rhonchi or rales. Gastrointestinal. Abdomen protuberant with no organomegaly, non tender, no rebound or guarding Skin. No rashes Musculoskeletal: no joint deformities   The results of significant diagnostics from this hospitalization (including imaging, microbiology, ancillary and laboratory) are listed below for reference.     Microbiology: Recent Results (from the past 240 hour(s))  Blood Culture (routine x 2)     Status: None   Collection Time: 12/15/19 11:30 PM   Specimen: BLOOD  Result Value Ref Range Status   Specimen Description BLOOD LEFT ANTECUBITAL  Final   Special Requests   Final    BOTTLES DRAWN AEROBIC AND ANAEROBIC Blood Culture adequate volume   Culture   Final    NO GROWTH 5 DAYS Performed at [facility], [address], [place], [place] [address]    Report Status 12/20/2019 FINAL  Final  Blood Culture (routine x 2)     Status: None   Collection Time: 12/15/19 11:30 PM   Specimen: BLOOD LEFT HAND  Result Value Ref Range Status    Specimen Description BLOOD LEFT HAND  Final   Special Requests   Final    BOTTLES DRAWN AEROBIC AND ANAEROBIC Blood Culture adequate volume   Culture   Final    NO GROWTH 5 DAYS Performed at [facility], [place] [address], [place], [place] [address]    Report Status 12/20/2019 FINAL  Final  SARS CORONAVIRUS 2 (TAT 6-24 HRS) Nasopharyngeal Nasopharyngeal Swab     Status: None   Collection Time: 12/16/19  2:09 AM   Specimen: Nasopharyngeal Swab  Result Value Ref Range Status   SARS Coronavirus 2 NEGATIVE NEGATIVE Final    Comment: (NOTE) SARS-CoV-2 target nucleic acids are NOT DETECTED. The SARS-CoV-2 RNA is generally detectable in upper and lower respiratory specimens during the acute phase of infection. Negative results do not preclude SARS-CoV-2 infection, do not rule out co-infections with other pathogens, and should not be used as the sole basis for treatment or other patient management decisions. Negative results must be combined with clinical observations, patient history, and epidemiological information. The expected result is Negative. Fact Sheet for Patients: SugarRoll.be Fact Sheet for Healthcare Providers: https://www.woods-mathews.com/ This test is not yet approved or cleared by  the [employer] and  has been authorized for detection and/or diagnosis of SARS-CoV-2 by FDA under an Emergency Use Authorization (EUA). This EUA will remain  in effect (meaning this test can be used) for the duration of the COVID-19 declaration under Section 56 4(b)(1) of the Act, 21 U.S.C. section 360bbb-3(b)(1), unless the authorization is terminated or revoked sooner. Performed at [facility], [place] [address], [place], [place] [address]   GI pathogen panel by PCR, stool     Status: None   Collection Time: 12/16/19  2:56 AM   Specimen: Stool  Result Value Ref Range Status   Plesiomonas shigelloides NOT DETECTED NOT DETECTED Final    Yersinia enterocolitica NOT DETECTED NOT DETECTED Final   Vibrio NOT DETECTED NOT DETECTED Final   Enteropathogenic E coli NOT DETECTED NOT DETECTED Final   E coli (ETEC) LT/ST NOT DETECTED NOT DETECTED Final   E coli 8315 by PCR Not applicable NOT DETECTED Final   Cryptosporidium by PCR NOT DETECTED NOT DETECTED Final   Entamoeba histolytica NOT DETECTED NOT DETECTED Final   Adenovirus F 40/41 NOT DETECTED NOT DETECTED Final   Norovirus GI/GII NOT DETECTED NOT DETECTED Final   Sapovirus NOT DETECTED NOT DETECTED Final    Comment: (NOTE) Performed At: [facility] [place], [place] [id number] Rush Farmer MD [id number]    Vibrio cholerae NOT DETECTED NOT DETECTED Final   Campylobacter by PCR NOT DETECTED NOT DETECTED Final   Salmonella by PCR NOT DETECTED NOT DETECTED Final   E coli (STEC) NOT DETECTED NOT DETECTED Final   Enteroaggregative E coli NOT DETECTED NOT DETECTED Final   Shigella by PCR NOT DETECTED NOT DETECTED Final   Cyclospora cayetanensis NOT DETECTED NOT DETECTED Final   Astrovirus NOT DETECTED NOT DETECTED Final   G lamblia by PCR NOT DETECTED NOT DETECTED Final   Rotavirus A by PCR NOT DETECTED NOT DETECTED Final  C difficile quick scan w PCR reflex     Status: None   Collection Time: 12/16/19  2:56 AM   Specimen: Stool  Result Value Ref Range Status   C Diff antigen NEGATIVE NEGATIVE Final   C Diff toxin NEGATIVE NEGATIVE Final   C Diff interpretation No C. difficile detected.  Final    Comment: Performed at [facility], [place] [address] [place], [place] [address]  Urine culture     Status: None   Collection Time: 12/16/19  9:08 AM   Specimen: In/Out Cath Urine  Result Value Ref Range Status   Specimen Description IN/OUT CATH URINE  Final   Special Requests NONE  Final   Culture   Final    NO GROWTH Performed at [facility], [place] [address], [place], [place] [address]    Report Status 12/17/2019 FINAL  Final      Labs: BNP (last 3 results) No results for input(s): BNP in the last 8760 hours. Basic Metabolic Panel: Recent Labs  Lab 12/17/19 0133 12/18/19 1426 12/19/19 0852 12/20/19 1245 12/21/19 0417  NA 137 141 140 138 140  K 3.9 3.2* 3.5 3.6 3.6  CL 106 106 105 104 105  CO2 22 24 25 23 26   GLUCOSE 202* 188* 200* 247* 186*  BUN 14 11 15 13 10   CREATININE 1.01 1.09 0.90 0.93 0.75  CALCIUM 8.3* 8.7* 8.5* 8.4* 8.7*   Liver Function Tests: Recent Labs  Lab 12/15/19 2141 12/17/19 0133  AST 17 14*  ALT 30 22  ALKPHOS 92 64  BILITOT 0.6 0.3  PROT 7.6 5.7*  ALBUMIN 3.6 2.8*   Recent Labs  Lab 12/15/19 2141  LIPASE 25   No results for input(s): AMMONIA in the last 168 hours. CBC: Recent Labs  Lab 12/16/19 0340 12/17/19 0133 12/19/19 0852 12/20/19 1245 12/21/19 0417  WBC 19.1* 14.5* 16.3* 15.0* 14.0*  NEUTROABS  --   --  9.5* 8.1* 7.2  HGB 18.8* 16.9 16.5 16.5 15.5  HCT 54.6* 49.1 47.2 46.9 45.1  MCV 85.6 84.7 85.4 83.9 85.9  PLT 358 292 306 343 290   Cardiac Enzymes: No results for input(s): CKTOTAL, CKMB, CKMBINDEX, TROPONINI in the last 168 hours. BNP: Invalid input(s): POCBNP CBG: Recent Labs  Lab 12/20/19 0809 12/20/19 1203 12/20/19 1616 12/20/19 2107 12/21/19 0759  GLUCAP 157* 274* 192* 276* 172*   D-Dimer No results for input(s): DDIMER in the last 72 hours. Hgb A1c No results for input(s): HGBA1C in the last 72 hours. Lipid Profile No results for input(s): CHOL, HDL, LDLCALC, TRIG, CHOLHDL, LDLDIRECT in the last 72 hours. Thyroid function studies No results for input(s): TSH, T4TOTAL, T3FREE, THYROIDAB in the last 72 hours.  Invalid input(s): FREET3 Anemia work up No results for input(s): VITAMINB12, FOLATE, FERRITIN, TIBC, IRON, RETICCTPCT in the last 72 hours. Urinalysis    Component Value Date/Time   COLORURINE AMBER (A) 12/15/2019 2140   APPEARANCEUR HAZY (A) 12/15/2019 2140   LABSPEC 1.033 (H) 12/15/2019 2140   PHURINE 5.0 12/15/2019  2140   GLUCOSEU 150 (A) 12/15/2019 2140   HGBUR NEGATIVE 12/15/2019 2140   BILIRUBINUR NEGATIVE 12/15/2019 2140   KETONESUR NEGATIVE 12/15/2019 2140   PROTEINUR 30 (A) 12/15/2019 2140   UROBILINOGEN 0.2 12/22/2014 1753   NITRITE NEGATIVE 12/15/2019 2140   LEUKOCYTESUR NEGATIVE 12/15/2019 2140   Sepsis Labs Invalid input(s): PROCALCITONIN,  WBC,  LACTICIDVEN Microbiology Recent Results (from the past 240 hour(s))  Blood Culture (routine x 2)     Status: None   Collection Time: 12/15/19 11:30 PM   Specimen: BLOOD  Result Value Ref Range Status   Specimen Description BLOOD LEFT ANTECUBITAL  Final   Special Requests   Final    BOTTLES DRAWN AEROBIC AND ANAEROBIC Blood Culture adequate volume   Culture   Final    NO GROWTH 5 DAYS Performed at [facility], [address] [place], [place] [address]    Report Status 12/20/2019 FINAL  Final  Blood Culture (routine x 2)     Status: None   Collection Time: 12/15/19 11:30 PM   Specimen: BLOOD LEFT HAND  Result Value Ref Range Status   Specimen Description BLOOD LEFT HAND  Final   Special Requests   Final    BOTTLES DRAWN AEROBIC AND ANAEROBIC Blood Culture adequate volume   Culture   Final    NO GROWTH 5 DAYS Performed at [facility], [place] [address] [place], [place] [address]    Report Status 12/20/2019 FINAL  Final  SARS CORONAVIRUS 2 (TAT 6-24 HRS) Nasopharyngeal Nasopharyngeal Swab     Status: None   Collection Time: 12/16/19  2:09 AM   Specimen: Nasopharyngeal Swab  Result Value Ref Range Status   SARS Coronavirus 2 NEGATIVE NEGATIVE Final    Comment: (NOTE) SARS-CoV-2 target nucleic acids are NOT DETECTED. The SARS-CoV-2 RNA is generally detectable in upper and lower respiratory specimens during the acute phase of infection. Negative results do not preclude SARS-CoV-2 infection, do not rule out co-infections with other pathogens,  and should not be used as the sole basis for treatment or other patient  management decisions. Negative results must be combined with clinical observations, patient history, and epidemiological information. The expected result is Negative. Fact Sheet for Patients: SugarRoll.be Fact Sheet for Healthcare Providers: https://www.woods-mathews.com/ This test is not yet approved or cleared by the [place] FDA and  has been authorized for detection and/or diagnosis of SARS-CoV-2 by FDA under an Emergency Use Authorization (EUA). This EUA will remain  in effect (meaning this test can be used) for the duration of the COVID-19 declaration under Section 56 4(b)(1) of the Act, 21 U.S.C. section 360bbb-3(b)(1), unless the authorization is terminated or revoked sooner. Performed at [facility], [place] [address], [place], [place] [address]   GI pathogen panel by PCR, stool     Status: None   Collection Time: 12/16/19  2:56 AM   Specimen: Stool  Result Value Ref Range Status   Plesiomonas shigelloides NOT DETECTED NOT DETECTED Final   Yersinia enterocolitica NOT DETECTED NOT DETECTED Final   Vibrio NOT DETECTED NOT DETECTED Final   Enteropathogenic E coli NOT DETECTED NOT DETECTED Final   E coli (ETEC) LT/ST NOT DETECTED NOT DETECTED Final   E coli 2376 by PCR Not applicable NOT DETECTED Final   Cryptosporidium by PCR NOT DETECTED NOT DETECTED Final   Entamoeba histolytica NOT DETECTED NOT DETECTED Final   Adenovirus F 40/41 NOT DETECTED NOT DETECTED Final   Norovirus GI/GII NOT DETECTED NOT DETECTED Final   Sapovirus NOT DETECTED NOT DETECTED Final    Comment: (NOTE) Performed At: [facility] [place], [place] [id number] Rush Farmer MD YW:[id number]    Vibrio cholerae NOT DETECTED NOT DETECTED Final   Campylobacter by PCR NOT DETECTED NOT DETECTED Final   Salmonella by PCR NOT DETECTED NOT DETECTED Final   E coli (STEC) NOT DETECTED NOT DETECTED Final   Enteroaggregative E coli NOT  DETECTED NOT DETECTED Final   Shigella by PCR NOT DETECTED NOT DETECTED Final   Cyclospora cayetanensis NOT DETECTED NOT DETECTED Final   Astrovirus NOT DETECTED NOT DETECTED Final   G lamblia by PCR NOT DETECTED NOT DETECTED Final   Rotavirus A by PCR NOT DETECTED NOT DETECTED Final  C difficile quick scan w PCR reflex     Status: None   Collection Time: 12/16/19  2:56 AM   Specimen: Stool  Result Value Ref Range Status   C Diff antigen NEGATIVE NEGATIVE Final   C Diff toxin NEGATIVE NEGATIVE Final   C Diff interpretation No C. difficile detected.  Final    Comment: Performed at [facility], [place] [address], [place], [place] [address]  Urine culture     Status: None   Collection Time: 12/16/19  9:08 AM   Specimen: In/Out Cath Urine  Result Value Ref Range Status   Specimen Description IN/OUT CATH URINE  Final   Special Requests NONE  Final   Culture   Final    NO GROWTH Performed at [facility], [place] [address], [place], [place] [address]    Report Status 12/17/2019 FINAL  Final     Time coordinating discharge: 45 minutes  SIGNED:   Tawni Millers, MD  [facility] 12/21/2019, 11:31 AM

## 2019-12-22 LAB — SURGICAL PATHOLOGY

## 2019-12-23 LAB — PATHOLOGIST SMEAR REVIEW: Path Review: INCREASED

## 2020-01-03 ENCOUNTER — Other Ambulatory Visit: Payer: Self-pay

## 2020-01-03 ENCOUNTER — Encounter (HOSPITAL_COMMUNITY): Payer: Self-pay

## 2020-01-03 ENCOUNTER — Emergency Department (HOSPITAL_COMMUNITY)
Admission: EM | Admit: 2020-01-03 | Discharge: 2020-01-03 | Disposition: A | Discharge status: Home / Self Care | Payer: BC Managed Care – PPO | Attending: Emergency Medicine | Admitting: Emergency Medicine

## 2020-01-03 ENCOUNTER — Emergency Department (HOSPITAL_COMMUNITY): Payer: BC Managed Care – PPO

## 2020-01-03 DIAGNOSIS — I10 Essential (primary) hypertension: Secondary | ICD-10-CM | POA: Diagnosis not present

## 2020-01-03 DIAGNOSIS — R197 Diarrhea, unspecified: Secondary | ICD-10-CM | POA: Diagnosis not present

## 2020-01-03 DIAGNOSIS — R1084 Generalized abdominal pain: Secondary | ICD-10-CM | POA: Diagnosis not present

## 2020-01-03 DIAGNOSIS — E119 Type 2 diabetes mellitus without complications: Secondary | ICD-10-CM | POA: Insufficient documentation

## 2020-01-03 DIAGNOSIS — Z7984 Long term (current) use of oral hypoglycemic drugs: Secondary | ICD-10-CM | POA: Diagnosis not present

## 2020-01-03 DIAGNOSIS — R112 Nausea with vomiting, unspecified: Secondary | ICD-10-CM | POA: Diagnosis not present

## 2020-01-03 DIAGNOSIS — Z20822 Contact with and (suspected) exposure to covid-19: Secondary | ICD-10-CM | POA: Diagnosis not present

## 2020-01-03 DIAGNOSIS — R109 Unspecified abdominal pain: Secondary | ICD-10-CM

## 2020-01-03 DIAGNOSIS — Z79899 Other long term (current) drug therapy: Secondary | ICD-10-CM | POA: Insufficient documentation

## 2020-01-03 LAB — CBC
HCT: 52.2 % — ABNORMAL HIGH (ref 39.0–52.0)
Hemoglobin: 18.3 g/dL — ABNORMAL HIGH (ref 13.0–17.0)
MCH: 29.9 pg (ref 26.0–34.0)
MCHC: 35.1 g/dL (ref 30.0–36.0)
MCV: 85.3 fL (ref 80.0–100.0)
Platelets: 433 10*3/uL — ABNORMAL HIGH (ref 150–400)
RBC: 6.12 MIL/uL — ABNORMAL HIGH (ref 4.22–5.81)
RDW: 12.3 % (ref 11.5–15.5)
WBC: 15.2 10*3/uL — ABNORMAL HIGH (ref 4.0–10.5)
nRBC: 0 % (ref 0.0–0.2)

## 2020-01-03 LAB — RESPIRATORY PANEL BY RT PCR (FLU A&B, COVID)
Influenza A by PCR: NEGATIVE
Influenza B by PCR: NEGATIVE
SARS Coronavirus 2 by RT PCR: NEGATIVE

## 2020-01-03 LAB — URINALYSIS, ROUTINE W REFLEX MICROSCOPIC
Bilirubin Urine: NEGATIVE
Glucose, UA: 50 mg/dL — AB
Hgb urine dipstick: NEGATIVE
Ketones, ur: NEGATIVE mg/dL
Leukocytes,Ua: NEGATIVE
Nitrite: NEGATIVE
Protein, ur: NEGATIVE mg/dL
Specific Gravity, Urine: >1.046 — ABNORMAL HIGH (ref 1.005–1.030)
pH: 6 (ref 5.0–8.0)

## 2020-01-03 LAB — COMPREHENSIVE METABOLIC PANEL
ALT: 50 U/L — ABNORMAL HIGH (ref 0–44)
AST: 46 U/L — ABNORMAL HIGH (ref 15–41)
Albumin: 3.8 g/dL (ref 3.5–5.0)
Alkaline Phosphatase: 87 U/L (ref 38–126)
Anion gap: 13 (ref 5–15)
BUN: 16 mg/dL (ref 6–20)
CO2: 22 mmol/L (ref 22–32)
Calcium: 9.1 mg/dL (ref 8.9–10.3)
Chloride: 99 mmol/L (ref 98–111)
Creatinine, Ser: 0.92 mg/dL (ref 0.61–1.24)
GFR calc Af Amer: >60 mL/min (ref >60)
GFR calc non Af Amer: >60 mL/min (ref >60)
Glucose, Bld: 255 mg/dL — ABNORMAL HIGH (ref 70–99)
Potassium: 4.7 mmol/L (ref 3.5–5.1)
Sodium: 134 mmol/L — ABNORMAL LOW (ref 135–145)
Total Bilirubin: 2.1 mg/dL — ABNORMAL HIGH (ref 0.3–1.2)
Total Protein: 7.2 g/dL (ref 6.5–8.1)

## 2020-01-03 LAB — LIPASE, BLOOD: Lipase: 22 U/L (ref 11–51)

## 2020-01-03 MED ORDER — FENTANYL CITRATE (PF) 100 MCG/2ML IJ SOLN
100.0000 ug | Freq: Once | INTRAMUSCULAR | Status: AC
  Administered 2020-01-03: 100 ug via INTRAVENOUS
  Filled 2020-01-03: qty 2

## 2020-01-03 MED ORDER — SODIUM CHLORIDE 0.9 % IV SOLN: INTRAVENOUS | Status: DC

## 2020-01-03 MED ORDER — FENTANYL CITRATE (PF) 100 MCG/2ML IJ SOLN: 100.0000 ug | INTRAMUSCULAR | Status: DC | PRN

## 2020-01-03 MED ORDER — SODIUM CHLORIDE 0.9 % IV SOLN
1.0000 g | Freq: Once | INTRAVENOUS | Status: AC
  Administered 2020-01-03: 1 g via INTRAVENOUS
  Filled 2020-01-03: qty 10

## 2020-01-03 MED ORDER — METRONIDAZOLE IN NACL 5-0.79 MG/ML-% IV SOLN
500.0000 mg | Freq: Once | INTRAVENOUS | Status: AC
  Administered 2020-01-03: 08:00:00 500 mg via INTRAVENOUS
  Filled 2020-01-03: qty 100

## 2020-01-03 MED ORDER — HYDROCODONE-ACETAMINOPHEN 5-325 MG PO TABS: 2.0000 | ORAL_TABLET | ORAL | 0 refills | Status: DC | PRN

## 2020-01-03 MED ORDER — SODIUM CHLORIDE 0.9% FLUSH
3.0000 mL | Freq: Once | INTRAVENOUS | Status: AC
  Administered 2020-01-03: 3 mL via INTRAVENOUS

## 2020-01-03 MED ORDER — ONDANSETRON 4 MG PO TBDP: 4.0000 mg | ORAL_TABLET | Freq: Three times a day (TID) | ORAL | 0 refills | Status: DC | PRN

## 2020-01-03 MED ORDER — ONDANSETRON HCL 4 MG/2ML IJ SOLN
4.0000 mg | Freq: Once | INTRAMUSCULAR | Status: AC
  Administered 2020-01-03: 4 mg via INTRAVENOUS
  Filled 2020-01-03: qty 2

## 2020-01-03 MED ORDER — UP4 PROBIOTICS ADULT PO CAPS: 1.0000 | ORAL_CAPSULE | Freq: Three times a day (TID) | ORAL | 0 refills | Status: DC

## 2020-01-03 MED ORDER — IOHEXOL 300 MG/ML  SOLN
100.0000 mL | Freq: Once | INTRAMUSCULAR | Status: AC | PRN
  Administered 2020-01-03: 07:00:00 100 mL via INTRAVENOUS

## 2020-01-03 NOTE — ED Notes (Signed)
Pt to CT

## 2020-01-03 NOTE — ED Notes (Signed)
Patrick Munoz would like an update [phone].

## 2020-01-03 NOTE — ED Provider Notes (Signed)
I received pt in signout from Dr. Randal Buba. Per signout, recent admission for colitis w/ GI colonoscopy/endoscopy and negative GI pathogen panel. At time of signout, pt awaiting CT scan, had received empiric abx.  Lab work notable only for very mild elevation in LFTs with AST 46, ALT 50, bilirubin 2.1.  WBC 15.2 which is similar to recent values.  CT shows hepatic steatosis but no other acute findings to explain the patient's pain.  Given he is young and otherwise healthy, I highly doubt mesenteric ischemia.  He has a follow-up appointment with Dr. Collene Mares in 3 days. I recommended not continuing antibiotics as they may exacerbate his diarrhea.  I reviewed his recent pathology results from biopsies which showed presence of eosinophils.  I discussed this finding at length with the patient and his fiance, explaining that they would need to follow-up with GI to understand the significance of this finding and discuss further treatment plan.  I recommended initiating probiotics for now.  Patient was tolerating p.o. after receiving Zofran and I provided with Zofran to use as needed at home.  Fianc expressed frustration at not having an answer and did not like the GI they were supposed to see. She wanted to see another GI here; I offered to call GI on call but explained that they may not recommend anything beyond f/u in clinic. They declined call but accepted contact info for [facility] who is on call today for ED. I reviewed labs including AST/ALT and hepatic steatosis. Recommended alcohol cessation. I discussed supportive measures and return precautions.   Marithza Malachi, Wenda Overland, MD 01/03/20 1556

## 2020-01-03 NOTE — ED Provider Notes (Addendum)
[place] EMERGENCY DEPARTMENT Provider Note   CSN: [id number] Arrival date & time: 01/03/20  0457     History Chief Complaint  Patient presents with  . Abdominal Pain    Patrick Munoz is a 34 y.o. male.  The history is provided by the patient.  Abdominal Pain Pain location:  Generalized (worse on the left side) Pain quality: aching   Pain radiates to:  Does not radiate Pain severity:  Severe Onset quality:  Gradual Duration:  3 days Timing:  Constant Progression:  Worsening Chronicity:  Recurrent Context: not alcohol use and not medication withdrawal   Relieved by:  Nothing Worsened by:  Nothing Ineffective treatments:  None tried Associated symptoms: diarrhea, nausea and vomiting   Associated symptoms: no chest pain, no cough, no dysuria, no fever and no shortness of breath   Associated symptoms comment:  8 episodes a day per patient report  Risk factors: has not had multiple surgeries   Patient admitted in February with pancolitis return with same symptoms.  Patient reports n/v/d diarrhea and diffuse abdominal pain worst on the left.  No f/c/r.  No urinary symptoms.  No covid contacts.       Past Medical History:  Diagnosis Date  . Back pain   . Diabetes mellitus without complication ([place])   . Hypertension     Patient Active Problem List   Diagnosis Date Noted  . History of colonic polyps   . Obesity, Class III, BMI 40-49.9 (morbid obesity) ([place]) 12/19/2019  . Pancolitis ([place]) 12/16/2019  . SIRS (systemic inflammatory response syndrome) ([place]) 12/16/2019  . Polycythemia 12/16/2019  . Type 2 diabetes mellitus ([place]) 12/16/2019  . HTN (hypertension) 12/16/2019    Past Surgical History:  Procedure Laterality Date  . BIOPSY  12/17/2019   Procedure: BIOPSY;  Surgeon: Juanita Craver, MD;  Location: [facility];  Service: Endoscopy;;  . COLONOSCOPY N/A 12/17/2019   Procedure: COLONOSCOPY;  Surgeon: Juanita Craver, MD;  Location: [facility]  ENDOSCOPY;  Service: Endoscopy;  Laterality: N/A;  . POLYPECTOMY  12/17/2019   Procedure: POLYPECTOMY;  Surgeon: Juanita Craver, MD;  Location: [facility] ENDOSCOPY;  Service: Endoscopy;;       Family History  Problem Relation Age of Onset  . Colon cancer Maternal Grandfather   . Stomach cancer Maternal Grandfather     Social History   Tobacco Use  . Smoking status: Never Smoker  . Smokeless tobacco: Current User    Types: Snuff  Substance Use Topics  . Alcohol use: Yes    Comment: social  . Drug use: No    Home Medications Prior to Admission medications   Medication Sig Start Date End Date Taking? Authorizing Provider  albuterol (VENTOLIN HFA) 108 (90 Base) MCG/ACT inhaler Inhale 2 puffs into the lungs every 4 (four) hours as needed. 10/28/19   [provider]  blood glucose meter kit and supplies Dispense based on patient and insurance preference. Use up to four times daily as directed. (FOR ICD-10 E10.9, E11.9). 12/21/19   Arrien, Jimmy Picket, MD  glimepiride (AMARYL) 2 MG tablet Take 2 mg by mouth every morning. 11/25/19   [provider]  lisinopril (ZESTRIL) 5 MG tablet Take 5 mg by mouth daily. 12/10/19   [provider]  pregabalin (LYRICA) 100 MG capsule Take 100 mg by mouth 2 (two) times daily. 11/25/19   [provider]  TRULICITY 1.5 IW/9.7LG SOPN Inject 1.5 mg into the skin once a week. Tuesdays 11/28/19   [provider]    Allergies    Penicillins  Review of Systems   Review of Systems  Constitutional: Negative for fever.  HENT: Negative for congestion.   Eyes: Negative for visual disturbance.  Respiratory: Negative for cough and shortness of breath.   Cardiovascular: Negative for chest pain.  Gastrointestinal: Positive for abdominal pain, diarrhea, nausea and vomiting. Negative for anal bleeding.  Genitourinary: Negative for dysuria and flank pain.  Musculoskeletal: Negative for back pain.  Skin: Negative for rash.    Neurological: Negative for dizziness.  Psychiatric/Behavioral: Negative for agitation.  All other systems reviewed and are negative.   Physical Exam Updated Vital Signs BP (!) 146/107   Pulse (!) 118   Temp (!) 97.5 F (36.4 C) (Oral)   Resp 18   Ht _0  (1.905 m)   Wt (!) 142.9 kg   SpO2 98%   BMI 39.37 kg/m   Physical Exam Vitals and nursing note reviewed.  Constitutional:      General: He is not in acute distress.    Appearance: Normal appearance.  HENT:     Head: Normocephalic and atraumatic.     Nose: Nose normal.  Eyes:     Conjunctiva/sclera: Conjunctivae normal.     Pupils: Pupils are equal, round, and reactive to light.  Cardiovascular:     Rate and Rhythm: Regular rhythm. Tachycardia present.     Pulses: Normal pulses.     Heart sounds: Normal heart sounds.  Pulmonary:     Effort: Pulmonary effort is normal.     Breath sounds: Normal breath sounds.  Abdominal:     General: Abdomen is flat. Bowel sounds are normal.     Tenderness: There is abdominal tenderness. There is no guarding or rebound.     Comments: Diffuse tenderness  Musculoskeletal:        General: Normal range of motion.     Cervical back: Normal range of motion and neck supple.  Skin:    General: Skin is warm and dry.     Capillary Refill: Capillary refill takes less than 2 seconds.  Neurological:     General: No focal deficit present.     Mental Status: He is alert and oriented to person, place, and time.  Psychiatric:        Mood and Affect: Mood normal.        Behavior: Behavior normal.     ED Results / Procedures / Treatments   Labs (all labs ordered are listed, but only abnormal results are displayed) Results for orders placed or performed during the hospital encounter of 01/03/20  Lipase, blood  Result Value Ref Range   Lipase 22 11 - 51 U/L  Comprehensive metabolic panel  Result Value Ref Range   Sodium 134 (L) 135 - 145 mmol/L   Potassium 4.7 3.5 - 5.1 mmol/L    Chloride 99 98 - 111 mmol/L   CO2 22 22 - 32 mmol/L   Glucose, Bld 255 (H) 70 - 99 mg/dL   BUN 16 6 - 20 mg/dL   Creatinine, Ser 0.92 0.61 - 1.24 mg/dL   Calcium 9.1 8.9 - 10.3 mg/dL   Total Protein 7.2 6.5 - 8.1 g/dL   Albumin 3.8 3.5 - 5.0 g/dL   AST 46 (H) 15 - 41 U/L   ALT 50 (H) 0 - 44 U/L   Alkaline Phosphatase 87 38 - 126 U/L   Total Bilirubin 2.1 (H) 0.3 - 1.2 mg/dL   GFR calc non Af Amer >  60 >60 mL/min   GFR calc Af Amer >60 >60 mL/min   Anion gap 13 5 - 15  CBC  Result Value Ref Range   WBC 15.2 (H) 4.0 - 10.5 K/uL   RBC 6.12 (H) 4.22 - 5.81 MIL/uL   Hemoglobin 18.3 (H) 13.0 - 17.0 g/dL   HCT 52.2 (H) 39.0 - 52.0 %   MCV 85.3 80.0 - 100.0 fL   MCH 29.9 26.0 - 34.0 pg   MCHC 35.1 30.0 - 36.0 g/dL   RDW 12.3 11.5 - 15.5 %   Platelets 433 (H) 150 - 400 K/uL   nRBC 0.0 0.0 - 0.2 %   CT ABDOMEN PELVIS W CONTRAST  Result Date: 12/16/2019 CLINICAL DATA:  Right lower quadrant abdominal pain, cramping, nausea and vomiting EXAM: CT ABDOMEN AND PELVIS WITH CONTRAST TECHNIQUE: Multidetector CT imaging of the abdomen and pelvis was performed using the standard protocol following bolus administration of intravenous contrast. CONTRAST:  124m OMNIPAQUE IOHEXOL 300 MG/ML  SOLN COMPARISON:  Comparison CT 12/22/2014 FINDINGS: Lower chest: Lung bases are clear. Normal heart size. No pericardial effusion. Hepatobiliary: Diffuse hepatic hypoattenuation compatible with hepatic steatosis. No focal liver abnormality is seen. No gallstones, gallbladder wall thickening, or biliary dilatation. Pancreas: Unremarkable. No pancreatic ductal dilatation or surrounding inflammatory changes. Spleen: Normal in size without focal abnormality. Adrenals/Urinary Tract: Adrenal glands are unremarkable. Kidneys are normal, without renal calculi, focal lesion, or hydronephrosis. Bladder is unremarkable. Stomach/Bowel: Distal esophagus, stomach and duodenal sweep are unremarkable. No small bowel wall thickening or  dilatation. No evidence of obstruction. Normal appendix in the right lower quadrant (6/68) question some mild pancolonic edematous mural thickening and faint mucosal hyperemia. Vascular/Lymphatic: The aorta is normal caliber. No pathologically enlarged lymph nodes in the abdomen or pelvis. Major venous structures are unremarkable. Reproductive: The prostate and seminal vesicles are unremarkable. Other: No abdominopelvic free fluid or free gas. No bowel containing hernias. Musculoskeletal: No acute osseous abnormality or suspicious osseous lesion. Multilevel Schmorl's node formations in the vertebral bodies are similar to comparison study. IMPRESSION: 1. Pancolonic edematous changes could reflect an acute infectious or inflammatory pancolitis. 2. Normal appendix. 3. Hepatic steatosis. Electronically Signed   By: PLovena LeM.D.   On: 12/16/2019 00:40   DG Chest Port 1 View  Result Date: 12/15/2019 CLINICAL DATA:  Right upper quadrant pain EXAM: PORTABLE CHEST 1 VIEW COMPARISON:  08/04/2015 FINDINGS: Borderline cardiomegaly. No focal opacity or pleural effusion. No pneumothorax. IMPRESSION: No active disease.  Borderline cardiomegaly Electronically Signed   By: KDonavan FoilM.D.   On: 12/15/2019 23:32    Radiology No results found.  Procedures Procedures (including critical care time)  Medications Ordered in ED Medications  cefTRIAXone (ROCEPHIN) 1 g in sodium chloride 0.9 % 100 mL IVPB (has no administration in time range)  metroNIDAZOLE (FLAGYL) IVPB 500 mg (has no administration in time range)  0.9 %  sodium chloride infusion (has no administration in time range)  fentaNYL (SUBLIMAZE) injection 100 mcg (has no administration in time range)  sodium chloride flush (NS) 0.9 % injection 3 mL (3 mLs Intravenous Given 01/03/20 0539)  ondansetron (ZOFRAN) injection 4 mg (4 mg Intravenous Given 01/03/20 0539)  fentaNYL (SUBLIMAZE) injection 100 mcg (100 mcg Intravenous Given 01/03/20 0539)    ED  Course  I have reviewed the triage vital signs and the nursing notes.  Pertinent labs & imaging results that were available during my care of the patient were reviewed by me and considered in my medical  decision making (see chart for details).   Patient has received IV pain medication, IVF and zofran.  A PRN for pain medication was ordered. Cultures and covid have been sent   Signed out to Dr. Rex Kras pending CT and admission  Final Clinical Impression(s) / ED Diagnoses        Mayia Megill, MD 01/03/20 [id number]

## 2020-01-03 NOTE — ED Triage Notes (Signed)
Pt arrived via ED w/ c/o left sided abdominal pain x 3 days. Pt rates pain 9/10. Pt endorses n/v/d.

## 2020-01-03 NOTE — ED Notes (Signed)
Beverage at bedside; pt encouraged to drink

## 2020-01-03 NOTE — ED Notes (Signed)
Patient verbalizes understanding of discharge instructions. Opportunity for questioning and answers were provided. Pt discharged from ED. 

## 2020-01-08 LAB — CULTURE, BLOOD (ROUTINE X 2)
Culture: NO GROWTH
Culture: NO GROWTH

## 2021-04-23 ENCOUNTER — Encounter (HOSPITAL_BASED_OUTPATIENT_CLINIC_OR_DEPARTMENT_OTHER): Payer: Self-pay

## 2021-04-23 ENCOUNTER — Emergency Department (HOSPITAL_BASED_OUTPATIENT_CLINIC_OR_DEPARTMENT_OTHER)
Admission: EM | Admit: 2021-04-23 | Discharge: 2021-04-24 | Disposition: A | Discharge status: Home / Self Care | Payer: BC Managed Care – PPO | Attending: Emergency Medicine | Admitting: Emergency Medicine

## 2021-04-23 ENCOUNTER — Other Ambulatory Visit: Payer: Self-pay

## 2021-04-23 DIAGNOSIS — D72829 Elevated white blood cell count, unspecified: Secondary | ICD-10-CM | POA: Diagnosis not present

## 2021-04-23 DIAGNOSIS — I10 Essential (primary) hypertension: Secondary | ICD-10-CM | POA: Diagnosis not present

## 2021-04-23 DIAGNOSIS — R1033 Periumbilical pain: Secondary | ICD-10-CM | POA: Diagnosis present

## 2021-04-23 DIAGNOSIS — E119 Type 2 diabetes mellitus without complications: Secondary | ICD-10-CM | POA: Diagnosis not present

## 2021-04-23 DIAGNOSIS — Z7984 Long term (current) use of oral hypoglycemic drugs: Secondary | ICD-10-CM | POA: Diagnosis not present

## 2021-04-23 DIAGNOSIS — Z79899 Other long term (current) drug therapy: Secondary | ICD-10-CM | POA: Diagnosis not present

## 2021-04-23 DIAGNOSIS — K429 Umbilical hernia without obstruction or gangrene: Secondary | ICD-10-CM | POA: Diagnosis not present

## 2021-04-23 LAB — COMPREHENSIVE METABOLIC PANEL
ALT: 28 U/L (ref 0–44)
AST: 18 U/L (ref 15–41)
Albumin: 3.8 g/dL (ref 3.5–5.0)
Alkaline Phosphatase: 106 U/L (ref 38–126)
Anion gap: 10 (ref 5–15)
BUN: 14 mg/dL (ref 6–20)
CO2: 24 mmol/L (ref 22–32)
Calcium: 9.2 mg/dL (ref 8.9–10.3)
Chloride: 102 mmol/L (ref 98–111)
Creatinine, Ser: 0.84 mg/dL (ref 0.61–1.24)
GFR, Estimated: >60 mL/min (ref >60)
Glucose, Bld: 311 mg/dL — ABNORMAL HIGH (ref 70–99)
Potassium: 3.7 mmol/L (ref 3.5–5.1)
Sodium: 136 mmol/L (ref 135–145)
Total Bilirubin: 0.4 mg/dL (ref 0.3–1.2)
Total Protein: 6.6 g/dL (ref 6.5–8.1)

## 2021-04-23 LAB — CBC WITH DIFFERENTIAL/PLATELET
Abs Immature Granulocytes: 0.05 10*3/uL (ref 0.00–0.07)
Basophils Absolute: 0.1 10*3/uL (ref 0.0–0.1)
Basophils Relative: 1 %
Eosinophils Absolute: 0.7 10*3/uL — ABNORMAL HIGH (ref 0.0–0.5)
Eosinophils Relative: 6 %
HCT: 43 % (ref 39.0–52.0)
Hemoglobin: 15.3 g/dL (ref 13.0–17.0)
Immature Granulocytes: 0 %
Lymphocytes Relative: 35 %
Lymphs Abs: 3.9 10*3/uL (ref 0.7–4.0)
MCH: 29.7 pg (ref 26.0–34.0)
MCHC: 35.6 g/dL (ref 30.0–36.0)
MCV: 83.3 fL (ref 80.0–100.0)
Monocytes Absolute: 0.8 10*3/uL (ref 0.1–1.0)
Monocytes Relative: 7 %
Neutro Abs: 5.8 10*3/uL (ref 1.7–7.7)
Neutrophils Relative %: 51 %
Platelets: 248 10*3/uL (ref 150–400)
RBC: 5.16 MIL/uL (ref 4.22–5.81)
RDW: 12.9 % (ref 11.5–15.5)
WBC: 11.3 10*3/uL — ABNORMAL HIGH (ref 4.0–10.5)
nRBC: 0 % (ref 0.0–0.2)

## 2021-04-23 LAB — LIPASE, BLOOD: Lipase: 25 U/L (ref 11–51)

## 2021-04-23 LAB — LACTIC ACID, PLASMA: Lactic Acid, Venous: 2.3 mmol/L (ref 0.5–1.9)

## 2021-04-23 MED ORDER — SODIUM CHLORIDE 0.9 % IV BOLUS (SEPSIS)
1000.0000 mL | Freq: Once | INTRAVENOUS | Status: AC
  Administered 2021-04-23: 1000 mL via INTRAVENOUS

## 2021-04-23 MED ORDER — SODIUM CHLORIDE 0.9 % IV BOLUS (SEPSIS)
1000.0000 mL | Freq: Once | INTRAVENOUS | Status: AC
  Administered 2021-04-24: 1000 mL via INTRAVENOUS

## 2021-04-23 MED ORDER — SODIUM CHLORIDE 0.9 % IV SOLN: 1000.0000 mL | INTRAVENOUS | Status: DC

## 2021-04-23 MED ORDER — HYDROMORPHONE HCL 1 MG/ML IJ SOLN
1.0000 mg | Freq: Once | INTRAMUSCULAR | Status: AC
Start: 2021-04-23 — End: 2021-04-23
  Administered 2021-04-23: 1 mg via INTRAVENOUS
  Filled 2021-04-23: qty 1

## 2021-04-23 NOTE — ED Triage Notes (Addendum)
Pt states, "I have a hernia" and points to his umbilicus. Pt states he has had the abd pain for one month and the pain has worsened, which brought him to the ED tonight. Denies N/V/D. Last BM this morning.

## 2021-04-23 NOTE — ED Notes (Signed)
Dr. Leonette Monarch aware of lactic 2.3

## 2021-04-23 NOTE — ED Provider Notes (Addendum)
[facility] Provider Note  CSN: [id number] Arrival date & time: 04/23/21 2232  Chief Complaint(s) Hernia  HPI Patrick Munoz is a 35 y.o. male with past medical history listed below including ulcerative colitis who presents to the emergency department with 1 month of mild to moderate abdominal pain that worsened in the past 2 days.  Patient reports that he is pain is periumbilical.  Reports that he notices bulge with coughing and certain positions.  He believes he had a hernia.  His tenderness is in the periumbilical region and also on the right side of the mid.  It is worse with movements and palpation.  He denies any nausea or vomiting.  No diarrhea or bloody bowel movements.  Patient still having bowel movements and passing gas.  No fevers or chills.  No other physical.  Patient is compliant with all his medications  The history is provided by the patient.   Past Medical History Past Medical History:  Diagnosis Date   Back pain    Diabetes mellitus without complication ([place])    Hypertension    Patient Active Problem List   Diagnosis Date Noted   History of colonic polyps    Obesity, Class III, BMI 40-49.9 (morbid obesity) ([place]) 12/19/2019   Pancolitis ([place]) 12/16/2019   SIRS (systemic inflammatory response syndrome) ([place]) 12/16/2019   Polycythemia 12/16/2019   Type 2 diabetes mellitus ([place]) 12/16/2019   HTN (hypertension) 12/16/2019   Home Medication(s) Prior to Admission medications   Medication Sig Start Date End Date Taking? Authorizing Provider  Hyoscyamine Sulfate SL (LEVSIN/SL) 0.125 MG SUBL Place 1 each under the tongue 4 (four) times daily as needed for up to 5 days. 04/24/21 04/29/21 Yes Yohan Samons, Grayce Sessions, MD  lidocaine (XYLOCAINE) 2 % solution Use as directed 15 mLs in the mouth or throat every 6 (six) hours as needed for mouth pain. 04/24/21  Yes Nevan Creighton, Grayce Sessions, MD  omeprazole (PRILOSEC) 20 MG capsule Take 1 capsule (20 mg  total) by mouth daily. 04/24/21  Yes Katina Remick, Grayce Sessions, MD  albuterol (VENTOLIN HFA) 108 (90 Base) MCG/ACT inhaler Inhale 2 puffs into the lungs every 4 (four) hours as needed for wheezing or shortness of breath.  10/28/19   [provider]  blood glucose meter kit and supplies Dispense based on patient and insurance preference. Use up to four times daily as directed. (FOR ICD-10 E10.9, E11.9). 12/21/19   Arrien, Jimmy Picket, MD  glimepiride (AMARYL) 2 MG tablet Take 2 mg by mouth every morning. 11/25/19   [provider]  HYDROcodone-acetaminophen (NORCO/VICODIN) 5-325 MG tablet Take 2 tablets by mouth every 4 (four) hours as needed. 01/03/20   Little, Wenda Overland, MD  lisinopril (ZESTRIL) 5 MG tablet Take 5 mg by mouth daily. 12/10/19   [provider]  ondansetron (ZOFRAN ODT) 4 MG disintegrating tablet Take 1 tablet (4 mg total) by mouth every 8 (eight) hours as needed for nausea or vomiting. 01/03/20   Little, Wenda Overland, MD  pregabalin (LYRICA) 100 MG capsule Take 100 mg by mouth 2 (two) times daily. 11/25/19   [provider]  Probiotic Product (UP4 PROBIOTICS ADULT) CAPS Take 1 capsule by mouth 3 (three) times daily before meals. Take 1 capsule by mouth 3 times daily before meals while having diarrhea 01/03/20   Little, Wenda Overland, MD  TRULICITY 1.5 BS/9.6GE SOPN Inject 1.5 mg into the skin once a week. Tuesdays 11/28/19   [provider]  Past Surgical History Past Surgical History:  Procedure Laterality Date   BIOPSY  12/17/2019   Procedure: BIOPSY;  Surgeon: Juanita Craver, MD;  Location: [facility];  Service: Endoscopy;;   COLONOSCOPY N/A 12/17/2019   Procedure: COLONOSCOPY;  Surgeon: Juanita Craver, MD;  Location: [facility] ENDOSCOPY;  Service: Endoscopy;  Laterality: N/A;   POLYPECTOMY  12/17/2019   Procedure:  POLYPECTOMY;  Surgeon: Juanita Craver, MD;  Location: [facility] ENDOSCOPY;  Service: Endoscopy;;   Family History Family History  Problem Relation Age of Onset   Colon cancer Maternal Grandfather    Stomach cancer Maternal Grandfather     Social History Social History   Tobacco Use   Smoking status: Never   Smokeless tobacco: Current    Types: Snuff  Substance Use Topics   Alcohol use: Yes    Comment: social   Drug use: No   Allergies Penicillins  Review of Systems Review of Systems All other systems are reviewed and are negative for acute change except as noted in the HPI  Physical Exam Vital Signs  I have reviewed the triage vital signs BP (!) 176/102   Pulse 77   Temp (!) 97.5 F (36.4 C) (Oral)   Resp 18   Ht 6' 3"  (1.905 m)   Wt (!) 138.5 kg   SpO2 97%   BMI 38.16 kg/m   Physical Exam Vitals reviewed.  Constitutional:      General: He is not in acute distress.    Appearance: He is well-developed. He is not diaphoretic.  HENT:     Head: Normocephalic and atraumatic.     Right Ear: External ear normal.     Left Ear: External ear normal.     Nose: Nose normal.     Mouth/Throat:     Mouth: Mucous membranes are moist.  Eyes:     General: No scleral icterus.    Conjunctiva/sclera: Conjunctivae normal.  Neck:     Trachea: Phonation normal.  Cardiovascular:     Rate and Rhythm: Normal rate and regular rhythm.  Pulmonary:     Effort: Pulmonary effort is normal. No respiratory distress.     Breath sounds: No stridor.  Abdominal:     General: There is no distension.     Tenderness: There is abdominal tenderness in the right upper quadrant, right lower quadrant, epigastric area and periumbilical area. There is guarding. There is no rebound. Negative signs include Murphy's sign and McBurney's sign.     Hernia: No hernia is present.  Musculoskeletal:        General: Normal range of motion.     Cervical back: Normal range of motion.  Neurological:     Mental  Status: He is alert and oriented to person, place, and time.  Psychiatric:        Behavior: Behavior normal.    ED Results and Treatments Labs (all labs ordered are listed, but only abnormal results are displayed) Labs Reviewed  CBC WITH DIFFERENTIAL/PLATELET - Abnormal; Notable for the following components:      Result Value   WBC 11.3 (*)    Eosinophils Absolute 0.7 (*)    All other components within normal limits  COMPREHENSIVE METABOLIC PANEL - Abnormal; Notable for the following components:   Glucose, Bld 311 (*)    All other components within normal limits  LACTIC ACID, PLASMA - Abnormal; Notable for the following components:   Lactic Acid, Venous 2.3 (*)    All other components within normal limits  URINALYSIS, ROUTINE  W REFLEX MICROSCOPIC - Abnormal; Notable for the following components:   Specific Gravity, Urine 1.050 (*)    Glucose, UA >1,000 (*)    All other components within normal limits  LIPASE, BLOOD  LACTIC ACID, PLASMA  LACTIC ACID, PLASMA                                                                                                                         EKG  EKG Interpretation  Date/Time:    Ventricular Rate:    PR Interval:    QRS Duration:   QT Interval:    QTC Calculation:   R Axis:     Text Interpretation:          Radiology CT ABDOMEN PELVIS W CONTRAST  Result Date: 04/24/2021 CLINICAL DATA:  Worsening abdominal pain for 1 month. Hernia. Inflammatory bowel disease. EXAM: CT ABDOMEN AND PELVIS WITH CONTRAST TECHNIQUE: Multidetector CT imaging of the abdomen and pelvis was performed using the standard protocol following bolus administration of intravenous contrast. CONTRAST:  118m OMNIPAQUE IOHEXOL 300 MG/ML  SOLN COMPARISON:  01/03/2020 FINDINGS: Lower chest: Lung bases are clear. Hepatobiliary: Diffuse fatty infiltration of the liver. No focal lesions. Tiny stones suggested in the gallbladder neck. No gallbladder wall thickening or edema. No  bile duct dilatation. Pancreas: Unremarkable. No pancreatic ductal dilatation or surrounding inflammatory changes. Spleen: Normal in size without focal abnormality. Adrenals/Urinary Tract: Adrenal glands are unremarkable. Kidneys are normal, without renal calculi, focal lesion, or hydronephrosis. Bladder is unremarkable. Stomach/Bowel: Stomach, small bowel, and colon are not abnormally distended. No small or large bowel distention. No wall thickening or inflammatory changes appreciated. Appendix is not identified. Vascular/Lymphatic: No significant vascular findings are present. No enlarged abdominal or pelvic lymph nodes. Reproductive: Prostate is unremarkable. Other: No free air or free fluid in the abdomen. Small periumbilical hernia containing fat. Musculoskeletal: No acute or significant osseous findings. IMPRESSION: 1. No acute process demonstrated in the abdomen or pelvis. No evidence of bowel obstruction or inflammation. 2. Fatty infiltration of the liver. 3. Cholelithiasis. 4. Small periumbilical hernia containing fat. Electronically Signed   By: WLucienne CapersM.D.   On: 04/24/2021 00:46    Pertinent labs & imaging results that were available during my care of the patient were reviewed by me and considered in my medical decision making (see chart for details).  Medications Ordered in ED Medications  sodium chloride 0.9 % bolus 1,000 mL (0 mLs Intravenous Stopped 04/24/21 0017)    Followed by  sodium chloride 0.9 % bolus 1,000 mL (0 mLs Intravenous Stopped 04/24/21 0111)    Followed by  0.9 %  sodium chloride infusion (has no administration in time range)  HYDROmorphone (DILAUDID) injection 1 mg (1 mg Intravenous Given 04/23/21 2320)  iohexol (OMNIPAQUE) 300 MG/ML solution 100 mL (100 mLs Intravenous Contrast Given 04/24/21 0025)  alum & mag hydroxide-simeth (MAALOX/MYLANTA) 200-200-20 MG/5ML suspension 30 mL (30 mLs Oral Given 04/24/21 0115)    And  lidocaine (XYLOCAINE)  2 % viscous mouth solution  15 mL (15 mLs Oral Given 04/24/21 0115)  hyoscyamine (LEVSIN SL) SL tablet 0.25 mg (0.25 mg Sublingual Given 04/24/21 0115)                                                                                                                                    Procedures Procedures  (including critical care time)  Medical Decision Making / ED Course I have reviewed the nursing notes for this encounter and the patient's prior records (if available in EHR or on provided paperwork).   [facility]oz was evaluated in Emergency Department on 04/24/2021 for the symptoms described in the history of present illness. He was evaluated in the context of the global COVID-19 pandemic, which necessitated consideration that the patient might be at risk for infection with the SARS-CoV-2 virus that causes COVID-19. Institutional protocols and algorithms that pertain to the evaluation of patients at risk for COVID-19 are in a state of rapid change based on information released by regulatory bodies including the CDC and federal and state organizations. These policies and algorithms were followed during the patient's care in the ED.  Patient presents with periumbilical and right-sided abdominal pain. Tenderness to palpation in the same regions. CBC with mild leukocytosis.  No anemia. Metabolic panel with hyperglycemia without evidence of DKA.  No other significant electrolyte derangements or renal sufficiency.  No evidence of bili obstruction or pancreatitis. CT scan obtained to rule out UC flare and negative for any intra-abdominal inflammatory/infectious process or bowel obstruction. It did show small fat containing umbilical hernia. I don't believe this is the cause of his pain tonight. UA without evidence of infection. Lactic acid mildly elevated.  Cleared with IV fluids. Patient given IV Dilaudid which provided minimal relief. He had more significant improvement/near complete resolution of his symptoms with a  GI cocktail favoring gastritis versus peptic ulcer disease. Patient is tolerating oral intake.      Final Clinical Impression(s) / ED Diagnoses Final diagnoses:  None   The patient appears reasonably screened and/or stabilized for discharge and I doubt any other medical condition or other [facility] requiring further screening, evaluation, or treatment in the ED at this time prior to discharge. Safe for discharge with strict return precautions.  Disposition: Discharge  Condition: Good  I have discussed the results, Dx and Tx plan with the patient/family who expressed understanding and agree(s) with the plan. Discharge instructions discussed at length. The patient/family was given strict return precautions who verbalized understanding of the instructions. No further questions at time of discharge.    ED Discharge Orders          Ordered    omeprazole (PRILOSEC) 20 MG capsule  Daily        04/24/21 0206    lidocaine (XYLOCAINE) 2 % solution  Every 6 hours PRN        04/24/21 0206  Hyoscyamine Sulfate SL (LEVSIN/SL) 0.125 MG SUBL  4 times daily PRN        04/24/21 0206             Follow Up: Scheryl Marten, [place] [address] [phone]  Call  as needed  Gastroenterology  Schedule an appointment as soon as possible for a visit  if symptoms do not improve or  worsen      This chart was dictated using voice recognition software.  Despite best efforts to proofread,  errors can occur which can change the documentation meaning.      Fatima Blank, MD 04/24/21 [phone]

## 2021-04-24 ENCOUNTER — Emergency Department (HOSPITAL_BASED_OUTPATIENT_CLINIC_OR_DEPARTMENT_OTHER): Payer: BC Managed Care – PPO

## 2021-04-24 LAB — LACTIC ACID, PLASMA: Lactic Acid, Venous: 1.3 mmol/L (ref 0.5–1.9)

## 2021-04-24 MED ORDER — ALUM & MAG HYDROXIDE-SIMETH 200-200-20 MG/5ML PO SUSP
30.0000 mL | Freq: Once | ORAL | Status: AC
  Administered 2021-04-24: 30 mL via ORAL
  Filled 2021-04-24: qty 30

## 2021-04-24 MED ORDER — HYOSCYAMINE SULFATE 0.125 MG SL SUBL
0.2500 mg | SUBLINGUAL_TABLET | Freq: Once | SUBLINGUAL | Status: AC
  Administered 2021-04-24: 0.25 mg via SUBLINGUAL
  Filled 2021-04-24: qty 2

## 2021-04-24 MED ORDER — OMEPRAZOLE 20 MG PO CPDR: 20.0000 mg | DELAYED_RELEASE_CAPSULE | Freq: Every day | ORAL | 0 refills | Status: DC

## 2021-04-24 MED ORDER — LIDOCAINE VISCOUS HCL 2 % MT SOLN: 15.0000 mL | Freq: Four times a day (QID) | OROMUCOSAL | 0 refills | Status: DC | PRN

## 2021-04-24 MED ORDER — HYOSCYAMINE SULFATE SL 0.125 MG SL SUBL: 1.0000 | SUBLINGUAL_TABLET | Freq: Four times a day (QID) | SUBLINGUAL | 0 refills | Status: DC | PRN

## 2021-04-24 MED ORDER — IOHEXOL 300 MG/ML  SOLN
100.0000 mL | Freq: Once | INTRAMUSCULAR | Status: AC | PRN
  Administered 2021-04-24: 100 mL via INTRAVENOUS

## 2021-04-24 MED ORDER — LIDOCAINE VISCOUS HCL 2 % MT SOLN
15.0000 mL | Freq: Once | OROMUCOSAL | Status: AC
  Administered 2021-04-24: 15 mL via ORAL
  Filled 2021-04-24: qty 15

## 2021-04-24 NOTE — ED Notes (Signed)
Patient transported to CT 

## 2021-04-24 NOTE — ED Notes (Signed)
Pt verbalizes understanding of discharge instructions. Opportunity for questioning and answers were provided. Armand removed by staff, pt discharged from ED to home. Educated to pick up Rx.

## 2021-04-25 LAB — URINALYSIS, ROUTINE W REFLEX MICROSCOPIC
Bilirubin Urine: NEGATIVE
Glucose, UA: >1000 mg/dL — AB
Hgb urine dipstick: NEGATIVE
Ketones, ur: NEGATIVE mg/dL
Leukocytes,Ua: NEGATIVE
Nitrite: NEGATIVE
Protein, ur: NEGATIVE mg/dL
Specific Gravity, Urine: >1.046 — ABNORMAL HIGH (ref 1.005–1.030)
pH: 6 (ref 5.0–8.0)

## 2021-07-08 ENCOUNTER — Ambulatory Visit: Payer: Self-pay | Admitting: General Surgery

## 2021-08-18 NOTE — Pre-Procedure Instructions (Addendum)
Surgical Instructions    Your procedure is scheduled on Friday, August 26, 2021 at 12:30 AM.  Report to [facility] Main Entrance "A" at 10:30 A.M., then check in with the Admitting office.  Call this number if you have problems the morning of surgery:  [phone]   If you have any questions prior to your surgery date call [phone]: Open Monday-Friday 8am-4pm    Remember:  Do not eat after midnight the night before your surgery  You may drink clear liquids until 9:30 AM the morning of your surgery.   Clear liquids allowed are: Water, Non-Citrus Juices (without pulp), Carbonated Beverages, Clear Tea, Black Coffee Only, and Gatorade  Please complete your G2 that was provided to you by 9:30 AM the morning of surgery.  Please, if able, drink it in one setting. DO NOT SIP.     Take these medicines the morning of surgery with A SIP OF WATER:  acetaminophen (TYLENOL) - if needed  As of today, STOP taking any Aspirin (unless otherwise instructed by your surgeon), mesalamine (LIALDA), Aleve, Naproxen, Ibuprofen, Motrin, Advil, Goody's, BC's, all herbal medications, fish oil, and all vitamins.   HOW TO MANAGE YOUR DIABETES BEFORE AND AFTER SURGERY  Why is it important to control my blood sugar before and after surgery? Improving blood sugar levels before and after surgery helps healing and can limit problems. A way of improving blood sugar control is eating a healthy diet by:  Eating less sugar and carbohydrates  Increasing activity/exercise  Talking with your doctor about reaching your blood sugar goals High blood sugars (greater than 180 mg/dL) can raise your risk of infections and slow your recovery, so you will need to focus on controlling your diabetes during the weeks before surgery. Make sure that the doctor who takes care of your diabetes knows about your planned surgery including the date and location.  How do I manage my blood sugar before surgery? Check your blood sugar  at least 4 times a day, starting 2 days before surgery, to make sure that the level is not too high or low.  Check your blood sugar the morning of your surgery when you wake up and every 2 hours until you get to the Short Stay unit.  If your blood sugar is less than 70 mg/dL, you will need to treat for low blood sugar: Do not take insulin. Treat a low blood sugar (less than 70 mg/dL) with  cup of clear juice (cranberry or apple), 4 glucose tablets, OR glucose gel. Recheck blood sugar in 15 minutes after treatment (to make sure it is greater than 70 mg/dL). If your blood sugar is not greater than 70 mg/dL on recheck, call [phone] for further instructions. Report your blood sugar to the short stay nurse when you get to Short Stay.  If you are admitted to the hospital after surgery: Your blood sugar will be checked by the staff and you will probably be given insulin after surgery (instead of oral diabetes medicines) to make sure you have good blood sugar levels. The goal for blood sugar control after surgery is 80-180 mg/dL.             Do NOT Smoke (Tobacco/Vaping) or drink Alcohol 24 hours prior to your procedure.  If you use a CPAP at night, you may bring all equipment for your overnight stay.   Contacts, glasses, piercing's, hearing aid's, dentures or partials may not be worn into surgery, please bring cases for these belongings.  For patients admitted to the hospital, discharge time will be determined by your treatment team.   Patients discharged the day of surgery will not be allowed to drive home, and someone needs to stay with them for 24 hours.  NO VISITORS WILL BE ALLOWED IN PRE-OP WHERE PATIENTS GET READY FOR SURGERY.  ONLY 1 SUPPORT PERSON MAY BE PRESENT IN THE WAITING ROOM WHILE YOU ARE IN SURGERY.  IF YOU ARE TO BE ADMITTED, ONCE YOU ARE IN YOUR ROOM YOU WILL BE ALLOWED TWO (2) VISITORS.  Minor children may have two parents present. Special consideration for safety and  communication needs will be reviewed on a case by case basis.   Special instructions:   [place]- Preparing For Surgery  Before surgery, you can play an important role. Because skin is not sterile, your skin needs to be as free of germs as possible. You can reduce the number of germs on your skin by washing with CHG (chlorahexidine gluconate) Soap before surgery.  CHG is an antiseptic cleaner which kills germs and bonds with the skin to continue killing germs even after washing.    Oral Hygiene is also important to reduce your risk of infection.  Remember - BRUSH YOUR TEETH THE MORNING OF SURGERY WITH YOUR REGULAR TOOTHPASTE  Please do not use if you have an allergy to CHG or antibacterial soaps. If your skin becomes reddened/irritated stop using the CHG.  Do not shave (including legs and underarms) for at least 48 hours prior to first CHG shower. It is OK to shave your face.  Please follow these instructions carefully.   Shower the NIGHT BEFORE SURGERY and the MORNING OF SURGERY  If you chose to wash your hair, wash your hair first as usual with your normal shampoo.  After you shampoo, rinse your hair and body thoroughly to remove the shampoo.  Use CHG Soap as you would any other liquid soap. You can apply CHG directly to the skin and wash gently with a scrungie or a clean washcloth.   Apply the CHG Soap to your body ONLY FROM THE NECK DOWN.  Do not use on open wounds or open sores. Avoid contact with your eyes, ears, mouth and genitals (private parts). Wash Face and genitals (private parts)  with your normal soap.   Wash thoroughly, paying special attention to the area where your surgery will be performed.  Thoroughly rinse your body with warm water from the neck down.  DO NOT shower/wash with your normal soap after using and rinsing off the CHG Soap.  Pat yourself dry with a CLEAN TOWEL.  Wear CLEAN PAJAMAS to bed the night before surgery  Place CLEAN SHEETS on your bed the  night before your surgery  DO NOT SLEEP WITH PETS.   Day of Surgery: Shower with CHG soap. Do not wear jewelry. Do not wear lotions, powders, colognes, or deodorant. Men may shave face and neck. Do not bring valuables to the hospital. [facility] is not responsible for any belongings or valuables. Wear Clean/Comfortable clothing the morning of surgery Remember to brush your teeth WITH YOUR REGULAR TOOTHPASTE.   Please read over the following fact sheets that you were given.   3 days prior to your procedure or After your COVID test   You are not required to quarantine however you are required to wear a well-fitting mask when you are out and around people not in your household. If your mask becomes wet or soiled, replace with a new  one.   Wash your hands often with soap and water for 20 seconds or clean your hands with an alcohol-based hand sanitizer that contains at least 60% alcohol.   Do not share personal items.   Notify your provider:  o if you are in close contact with someone who has COVID  o or if you develop a fever of 100.4 or greater, sneezing, cough, sore throat, shortness of breath or body aches.

## 2021-08-19 ENCOUNTER — Other Ambulatory Visit: Payer: Self-pay

## 2021-08-19 ENCOUNTER — Encounter (HOSPITAL_COMMUNITY)
Admission: RE | Admit: 2021-08-19 | Discharge: 2021-08-19 | Disposition: A | Discharge status: Home / Self Care | Payer: BC Managed Care – PPO | Source: Ambulatory Visit | Attending: General Surgery | Admitting: General Surgery

## 2021-08-19 ENCOUNTER — Encounter (HOSPITAL_COMMUNITY): Payer: Self-pay

## 2021-08-19 VITALS — BP 152/95 | HR 95 | Temp 97.8°F | Resp 18 | Ht 75.0 in | Wt 299.0 lb

## 2021-08-19 DIAGNOSIS — E119 Type 2 diabetes mellitus without complications: Secondary | ICD-10-CM | POA: Insufficient documentation

## 2021-08-19 DIAGNOSIS — Z01818 Encounter for other preprocedural examination: Secondary | ICD-10-CM | POA: Diagnosis present

## 2021-08-19 DIAGNOSIS — I10 Essential (primary) hypertension: Secondary | ICD-10-CM | POA: Insufficient documentation

## 2021-08-19 HISTORY — DX: Other complications of anesthesia, initial encounter: T88.59XA

## 2021-08-19 HISTORY — DX: Attention-deficit hyperactivity disorder, unspecified type: F90.9

## 2021-08-19 LAB — HEMOGLOBIN A1C
Hgb A1c MFr Bld: 10.1 % — ABNORMAL HIGH (ref 4.8–5.6)
Mean Plasma Glucose: 243.17 mg/dL

## 2021-08-19 LAB — BASIC METABOLIC PANEL
Anion gap: 10 (ref 5–15)
BUN: 11 mg/dL (ref 6–20)
CO2: 19 mmol/L — ABNORMAL LOW (ref 22–32)
Calcium: 8.4 mg/dL — ABNORMAL LOW (ref 8.9–10.3)
Chloride: 96 mmol/L — ABNORMAL LOW (ref 98–111)
Creatinine, Ser: 0.74 mg/dL (ref 0.61–1.24)
GFR, Estimated: >60 mL/min (ref >60)
Glucose, Bld: 365 mg/dL — ABNORMAL HIGH (ref 70–99)
Potassium: 6.8 mmol/L (ref 3.5–5.1)
Sodium: 125 mmol/L — ABNORMAL LOW (ref 135–145)

## 2021-08-19 LAB — CBC
HCT: 48.2 % (ref 39.0–52.0)
Hemoglobin: 17.6 g/dL — ABNORMAL HIGH (ref 13.0–17.0)
MCH: 30.4 pg (ref 26.0–34.0)
MCHC: 36.5 g/dL — ABNORMAL HIGH (ref 30.0–36.0)
MCV: 83.2 fL (ref 80.0–100.0)
Platelets: 228 10*3/uL (ref 150–400)
RBC: 5.79 MIL/uL (ref 4.22–5.81)
RDW: 12.2 % (ref 11.5–15.5)
WBC: 9.7 10*3/uL (ref 4.0–10.5)
nRBC: 0 % (ref 0.0–0.2)

## 2021-08-19 LAB — GLUCOSE, CAPILLARY: Glucose-Capillary: 433 mg/dL — ABNORMAL HIGH (ref 70–99)

## 2021-08-19 NOTE — Progress Notes (Signed)
PCP - Fredda Hammed, PA Cardiologist - Denies  PPM/ICD - Denies  Chest x-ray - N/A EKG - 08/19/21 Stress Test - Denies ECHO - Denies Cardiac Cath - Denies  Sleep Study - Denies  Fasting Blood Sugar - 230s Checks Blood Sugar __1___ time a day  Patient's blood sugar was high today during his Pat appt. He states that he has run out of his Semglee insulin and insurance will not pay for it. He hasn't taken any insulin in 2 weeks due to the expensive costs of the insulin and needles. Patient also stated that he takes Lisinopril for hypertension, but only takes it when he remembers. Was unable to document in the chart because he could not recall the dosage or frequency prescribed. Karoline Caldwell, PA aware.  Blood Thinner Instructions: N/A Aspirin Instructions: N/A  ERAS Protcol - Yes, G2  COVID TEST- N/A   Anesthesia review: Yes, elevated BP and CBG  Patient denies shortness of breath, fever, cough and chest pain at PAT appointment   All instructions explained to the patient, with a verbal understanding of the material. Patient agrees to go over the instructions while at home for a better understanding. Patient also instructed to self quarantine after being tested for COVID-19. The opportunity to ask questions was provided.

## 2021-08-19 NOTE — Progress Notes (Signed)
Anesthesia Chart Review:  Patient arrived to PAT with CBG of 433.  He reported having hamburger and sweet tea prior to arrival.  He states he has been out of his insulin for 2 weeks due to inability to afford the cost of $160 per month.  He said he is already discussed with his PCP Fredda Hammed, PA-C.  Working towards a solution.  Blood pressure also poorly controlled, 152/95 on arrival, 160/108 on recheck.  He stated he does not take his lisinopril consistently.  I advised him that with markedly uncontrolled blood sugar he has not optimized to undergo hernia repair with mesh, especially in light of him not being able to obtain his insulin.  At that point he did say he has some Semglee (insulin glargine -yfgn) but no needles to inject.  I advised him to purchase needles from the pharmacy and restart his medication ASAP and call his primary care provider to let them know how high his sugars are running.  He was also instructed to reduce dietary carbohydrate intake and take lisinopril as prescribed.  Preop blood work notable for potassium 6.8, however the sample was noted to be hemolyzed which likely affected the integrity of results.  This was reviewed with anesthesiologist Dr. Marcie Bal who agreed likely incorrect due to hemolysis.  Sodium also low at 125, however when corrected for blood glucose it was ~130.  A1c 10.1.  Remainder of labs unremarkable.  Preop lab results and concerns regarding ability to manage IDDM 2 in the setting of inadequate access to medications was discussed with surgeon's office.  EKG 08/19/21: NSR. Rate 70. Abnormal QRS-T angle, consider primary T wave abnormality.    Wynonia Musty [facility] Short Stay Center/Anesthesiology Phone [phone] 08/19/2021 4:54 PM

## 2021-08-19 NOTE — Progress Notes (Signed)
   08/19/21 1511  OBSTRUCTIVE SLEEP APNEA  Have you ever been diagnosed with sleep apnea through a sleep study? No  Do you snore loudly (loud enough to be heard through closed doors)?  1  Do you often feel tired, fatigued, or sleepy during the daytime (such as falling asleep during driving or talking to someone)? 0  Has anyone observed you stop breathing during your sleep? 1  Do you have, or are you being treated for high blood pressure? 1  BMI more than 35 kg/m2? 1  Age > 50 (1-yes) 0  Neck circumference greater than:Male 16 inches or larger, Male 17inches or larger? 1  Male Gender (Yes=1) 1  Obstructive Sleep Apnea Score 6  Score 5 or greater  Results sent to PCP

## 2021-08-22 NOTE — Anesthesia Preprocedure Evaluation (Addendum)
Anesthesia Evaluation  Patient identified by MRN, date of birth, ID band Patient awake    Reviewed: Allergy & Precautions, NPO status , Patient's Chart, lab work & pertinent test results  History of Anesthesia Complications Negative for: history of anesthetic complications  Airway Mallampati: II  TM Distance: >3 FB Neck ROM: Full    Dental no notable dental hx.    Pulmonary Current Smoker,    Pulmonary exam normal        Cardiovascular hypertension, Pt. on medications Normal cardiovascular exam     Neuro/Psych negative neurological ROS     GI/Hepatic Neg liver ROS, PUD,   Endo/Other  diabetes, Poorly Controlled, Type 2, Insulin Dependent  Renal/GU negative Renal ROS  negative genitourinary   Musculoskeletal negative musculoskeletal ROS (+)   Abdominal   Peds  Hematology negative hematology ROS (+)   Anesthesia Other Findings Day of surgery medications reviewed with patient.  Reproductive/Obstetrics negative OB ROS                           Anesthesia Physical Anesthesia Plan  ASA: 3  Anesthesia Plan: General   Post-op Pain Management:    Induction: Intravenous  PONV Risk Score and Plan: 2 and Treatment may vary due to age or medical condition, Ondansetron and Midazolam  Airway Management Planned: Oral ETT  Additional Equipment: None  Intra-op Plan:   Post-operative Plan: Extubation in OR  Informed Consent: I have reviewed the patients History and Physical, chart, labs and discussed the procedure including the risks, benefits and alternatives for the proposed anesthesia with the patient or authorized representative who has indicated his/her understanding and acceptance.     Dental advisory given  Plan Discussed with: CRNA  Anesthesia Plan Comments: (See PAT note by Karoline Caldwell, PA-C on 08/18/2021.  After PAT appointment, patient did get in contact with his PCP.  He has  been restarted on insulin and has follow-up appointment with PCP on 08/22/2021 to ensure he is optimized as much as possible for surgery.  Dr. Grandville Silos is aware of the situation and advised okay to proceed as long as patient is compliant with medications and blood pressure and blood sugar are acceptable on day of surgery.)      Anesthesia Quick Evaluation

## 2021-08-25 MED ORDER — CEFAZOLIN IN SODIUM CHLORIDE 3-0.9 GM/100ML-% IV SOLN
3.0000 g | INTRAVENOUS | Status: DC
  Filled 2021-08-25 (×2): qty 100

## 2021-08-26 ENCOUNTER — Encounter (HOSPITAL_COMMUNITY): Admission: RE | Payer: Self-pay | Source: Home / Self Care

## 2021-08-26 ENCOUNTER — Encounter (HOSPITAL_COMMUNITY)
Admission: RE | Disposition: A | Discharge status: Home / Self Care | Payer: Self-pay | Source: Home / Self Care | Attending: General Surgery

## 2021-08-26 ENCOUNTER — Ambulatory Visit (HOSPITAL_COMMUNITY): Admission: RE | Admit: 2021-08-26 | Payer: BC Managed Care – PPO | Source: Home / Self Care | Admitting: General Surgery

## 2021-08-26 ENCOUNTER — Ambulatory Visit (HOSPITAL_COMMUNITY): Payer: BC Managed Care – PPO | Admitting: Physician Assistant

## 2021-08-26 ENCOUNTER — Ambulatory Visit (HOSPITAL_COMMUNITY)
Admission: RE | Admit: 2021-08-26 | Discharge: 2021-08-26 | Disposition: A | Discharge status: Home / Self Care | Payer: BC Managed Care – PPO | Attending: General Surgery | Admitting: General Surgery

## 2021-08-26 ENCOUNTER — Encounter (HOSPITAL_COMMUNITY): Payer: Self-pay | Admitting: General Surgery

## 2021-08-26 DIAGNOSIS — Z888 Allergy status to other drugs, medicaments and biological substances status: Secondary | ICD-10-CM | POA: Insufficient documentation

## 2021-08-26 DIAGNOSIS — Z79899 Other long term (current) drug therapy: Secondary | ICD-10-CM | POA: Insufficient documentation

## 2021-08-26 DIAGNOSIS — F17290 Nicotine dependence, other tobacco product, uncomplicated: Secondary | ICD-10-CM | POA: Insufficient documentation

## 2021-08-26 DIAGNOSIS — K429 Umbilical hernia without obstruction or gangrene: Secondary | ICD-10-CM | POA: Insufficient documentation

## 2021-08-26 DIAGNOSIS — Z7985 Long-term (current) use of injectable non-insulin antidiabetic drugs: Secondary | ICD-10-CM | POA: Diagnosis not present

## 2021-08-26 DIAGNOSIS — I10 Essential (primary) hypertension: Secondary | ICD-10-CM | POA: Insufficient documentation

## 2021-08-26 DIAGNOSIS — Z794 Long term (current) use of insulin: Secondary | ICD-10-CM | POA: Insufficient documentation

## 2021-08-26 DIAGNOSIS — Z880 Allergy status to penicillin: Secondary | ICD-10-CM | POA: Insufficient documentation

## 2021-08-26 DIAGNOSIS — E119 Type 2 diabetes mellitus without complications: Secondary | ICD-10-CM | POA: Insufficient documentation

## 2021-08-26 HISTORY — PX: UMBILICAL HERNIA REPAIR: SHX196

## 2021-08-26 HISTORY — PX: INSERTION OF MESH: SHX5868

## 2021-08-26 LAB — BASIC METABOLIC PANEL
Anion gap: 6 (ref 5–15)
BUN: 17 mg/dL (ref 6–20)
CO2: 24 mmol/L (ref 22–32)
Calcium: 8.7 mg/dL — ABNORMAL LOW (ref 8.9–10.3)
Chloride: 101 mmol/L (ref 98–111)
Creatinine, Ser: 0.64 mg/dL (ref 0.61–1.24)
GFR, Estimated: >60 mL/min (ref >60)
Glucose, Bld: 180 mg/dL — ABNORMAL HIGH (ref 70–99)
Potassium: 4.6 mmol/L (ref 3.5–5.1)
Sodium: 131 mmol/L — ABNORMAL LOW (ref 135–145)

## 2021-08-26 LAB — GLUCOSE, CAPILLARY
Glucose-Capillary: 209 mg/dL — ABNORMAL HIGH (ref 70–99)
Glucose-Capillary: 175 mg/dL — ABNORMAL HIGH (ref 70–99)
Glucose-Capillary: 128 mg/dL — ABNORMAL HIGH (ref 70–99)

## 2021-08-26 SURGERY — REPAIR, HERNIA, UMBILICAL, ADULT
Anesthesia: General

## 2021-08-26 SURGERY — REPAIR, HERNIA, UMBILICAL, ADULT
Anesthesia: General | Site: Abdomen

## 2021-08-26 MED ORDER — DEXTROSE 5 % IV SOLN
INTRAVENOUS | Status: DC | PRN
  Administered 2021-08-26: 3 g via INTRAVENOUS

## 2021-08-26 MED ORDER — PROPOFOL 10 MG/ML IV BOLUS
INTRAVENOUS | Status: DC | PRN
  Administered 2021-08-26: 200 mg via INTRAVENOUS

## 2021-08-26 MED ORDER — MIDAZOLAM HCL 2 MG/2ML IJ SOLN
INTRAMUSCULAR | Status: DC | PRN
  Administered 2021-08-26: 2 mg via INTRAVENOUS

## 2021-08-26 MED ORDER — ROCURONIUM BROMIDE 10 MG/ML (PF) SYRINGE
PREFILLED_SYRINGE | INTRAVENOUS | Status: DC | PRN
  Administered 2021-08-26: 60 mg via INTRAVENOUS
  Administered 2021-08-26: 40 mg via INTRAVENOUS

## 2021-08-26 MED ORDER — CHLORHEXIDINE GLUCONATE CLOTH 2 % EX PADS: 6.0000 | MEDICATED_PAD | Freq: Once | CUTANEOUS | Status: DC

## 2021-08-26 MED ORDER — PROPOFOL 10 MG/ML IV BOLUS
INTRAVENOUS | Status: AC
  Filled 2021-08-26: qty 20

## 2021-08-26 MED ORDER — FENTANYL CITRATE (PF) 100 MCG/2ML IJ SOLN
INTRAMUSCULAR | Status: DC | PRN
  Administered 2021-08-26: 50 ug via INTRAVENOUS
  Administered 2021-08-26: 100 ug via INTRAVENOUS

## 2021-08-26 MED ORDER — ORAL CARE MOUTH RINSE: 15.0000 mL | Freq: Once | OROMUCOSAL | Status: AC

## 2021-08-26 MED ORDER — DEXAMETHASONE SODIUM PHOSPHATE 10 MG/ML IJ SOLN
INTRAMUSCULAR | Status: AC
  Filled 2021-08-26: qty 1

## 2021-08-26 MED ORDER — FENTANYL CITRATE (PF) 100 MCG/2ML IJ SOLN
INTRAMUSCULAR | Status: AC
  Administered 2021-08-26: 25 ug via INTRAVENOUS
  Filled 2021-08-26: qty 2

## 2021-08-26 MED ORDER — BUPIVACAINE-EPINEPHRINE (PF) 0.25% -1:200000 IJ SOLN
INTRAMUSCULAR | Status: AC
  Filled 2021-08-26: qty 30

## 2021-08-26 MED ORDER — FENTANYL CITRATE (PF) 250 MCG/5ML IJ SOLN
INTRAMUSCULAR | Status: AC
  Filled 2021-08-26: qty 5

## 2021-08-26 MED ORDER — LACTATED RINGERS IV SOLN: INTRAVENOUS | Status: DC

## 2021-08-26 MED ORDER — 0.9 % SODIUM CHLORIDE (POUR BTL) OPTIME
TOPICAL | Status: DC | PRN
  Administered 2021-08-26: 1000 mL

## 2021-08-26 MED ORDER — MIDAZOLAM HCL 2 MG/2ML IJ SOLN
INTRAMUSCULAR | Status: AC
  Filled 2021-08-26: qty 2

## 2021-08-26 MED ORDER — OXYCODONE HCL 5 MG PO TABS
ORAL_TABLET | ORAL | Status: AC
  Administered 2021-08-26: 5 mg via ORAL
  Filled 2021-08-26: qty 1

## 2021-08-26 MED ORDER — ACETAMINOPHEN 500 MG PO TABS
1000.0000 mg | ORAL_TABLET | ORAL | Status: AC
  Administered 2021-08-26: 1000 mg via ORAL
  Filled 2021-08-26: qty 2

## 2021-08-26 MED ORDER — ONDANSETRON HCL 4 MG/2ML IJ SOLN
INTRAMUSCULAR | Status: DC | PRN
  Administered 2021-08-26: 4 mg via INTRAVENOUS

## 2021-08-26 MED ORDER — PROMETHAZINE HCL 25 MG/ML IJ SOLN: 6.2500 mg | INTRAMUSCULAR | Status: DC | PRN

## 2021-08-26 MED ORDER — ONDANSETRON HCL 4 MG/2ML IJ SOLN
INTRAMUSCULAR | Status: AC
  Filled 2021-08-26: qty 2

## 2021-08-26 MED ORDER — SUGAMMADEX SODIUM 500 MG/5ML IV SOLN
INTRAVENOUS | Status: DC | PRN
  Administered 2021-08-26: 260 mg via INTRAVENOUS

## 2021-08-26 MED ORDER — CHLORHEXIDINE GLUCONATE 0.12 % MT SOLN
15.0000 mL | Freq: Once | OROMUCOSAL | Status: AC
  Administered 2021-08-26: 15 mL via OROMUCOSAL
  Filled 2021-08-26: qty 15

## 2021-08-26 MED ORDER — OXYCODONE HCL 5 MG PO TABS: 5.0000 mg | ORAL_TABLET | Freq: Once | ORAL | Status: AC | PRN

## 2021-08-26 MED ORDER — INSULIN ASPART 100 UNIT/ML IJ SOLN
2.0000 [IU] | Freq: Once | INTRAMUSCULAR | Status: AC
  Administered 2021-08-26: 2 [IU] via SUBCUTANEOUS
  Filled 2021-08-26: qty 0.02

## 2021-08-26 MED ORDER — ENSURE PRE-SURGERY PO LIQD: 296.0000 mL | Freq: Once | ORAL | Status: DC

## 2021-08-26 MED ORDER — BUPIVACAINE-EPINEPHRINE (PF) 0.25% -1:200000 IJ SOLN
INTRAMUSCULAR | Status: DC | PRN
  Administered 2021-08-26: 20 mL

## 2021-08-26 MED ORDER — LIDOCAINE 2% (20 MG/ML) 5 ML SYRINGE
INTRAMUSCULAR | Status: DC | PRN
  Administered 2021-08-26: 100 mg via INTRAVENOUS

## 2021-08-26 MED ORDER — GABAPENTIN 300 MG PO CAPS
300.0000 mg | ORAL_CAPSULE | ORAL | Status: AC
  Administered 2021-08-26: 300 mg via ORAL
  Filled 2021-08-26: qty 1

## 2021-08-26 MED ORDER — ROCURONIUM BROMIDE 10 MG/ML (PF) SYRINGE
PREFILLED_SYRINGE | INTRAVENOUS | Status: AC
  Filled 2021-08-26: qty 10

## 2021-08-26 MED ORDER — FENTANYL CITRATE (PF) 100 MCG/2ML IJ SOLN
25.0000 ug | INTRAMUSCULAR | Status: DC | PRN
  Administered 2021-08-26 (×2): 25 ug via INTRAVENOUS

## 2021-08-26 MED ORDER — OXYCODONE HCL 5 MG/5ML PO SOLN: 5.0000 mg | Freq: Once | ORAL | Status: AC | PRN

## 2021-08-26 SURGICAL SUPPLY — 33 items
BAG COUNTER SPONGE SURGICOUNT (BAG) ×2 IMPLANT
BLADE CLIPPER SURG (BLADE) IMPLANT
CANISTER SUCT 3000ML PPV (MISCELLANEOUS) ×2 IMPLANT
CHLORAPREP W/TINT 26 (MISCELLANEOUS) ×2 IMPLANT
COVER SURGICAL LIGHT HANDLE (MISCELLANEOUS) ×2 IMPLANT
DERMABOND ADVANCED (GAUZE/BANDAGES/DRESSINGS) ×1
DERMABOND ADVANCED .7 DNX12 (GAUZE/BANDAGES/DRESSINGS) ×1 IMPLANT
DRAPE LAPAROTOMY 100X72 PEDS (DRAPES) ×2 IMPLANT
ELECT REM PT RETURN 9FT ADLT (ELECTROSURGICAL) ×2
ELECTRODE REM PT RTRN 9FT ADLT (ELECTROSURGICAL) ×1 IMPLANT
GLOVE SRG 8 PF TXTR STRL LF DI (GLOVE) ×1 IMPLANT
GLOVE SURG ENC MOIS LTX SZ8 (GLOVE) ×2 IMPLANT
GLOVE SURG UNDER POLY LF SZ8 (GLOVE) ×1
GOWN STRL REUS W/ TWL LRG LVL3 (GOWN DISPOSABLE) ×1 IMPLANT
GOWN STRL REUS W/ TWL XL LVL3 (GOWN DISPOSABLE) ×1 IMPLANT
GOWN STRL REUS W/TWL LRG LVL3 (GOWN DISPOSABLE) ×1
GOWN STRL REUS W/TWL XL LVL3 (GOWN DISPOSABLE) ×1
KIT BASIN OR (CUSTOM PROCEDURE TRAY) ×2 IMPLANT
KIT TURNOVER KIT B (KITS) ×2 IMPLANT
MESH VENTRALEX ST 2.5 CRC MED (Mesh General) ×2 IMPLANT
NEEDLE 22X1 1/2 (OR ONLY) (NEEDLE) ×2 IMPLANT
NS IRRIG 1000ML POUR BTL (IV SOLUTION) ×2 IMPLANT
PACK GENERAL/GYN (CUSTOM PROCEDURE TRAY) ×2 IMPLANT
PAD ARMBOARD 7.5X6 YLW CONV (MISCELLANEOUS) ×2 IMPLANT
SUT MNCRL AB 4-0 PS2 18 (SUTURE) ×2 IMPLANT
SUT PROLENE 0 CT 1 30 (SUTURE) ×4 IMPLANT
SUT VIC AB 2-0 CT1 27 (SUTURE) ×1
SUT VIC AB 2-0 CT1 TAPERPNT 27 (SUTURE) ×1 IMPLANT
SUT VIC AB 3-0 SH 27 (SUTURE) ×1
SUT VIC AB 3-0 SH 27XBRD (SUTURE) ×1 IMPLANT
SYR CONTROL 10ML LL (SYRINGE) ×2 IMPLANT
TOWEL GREEN STERILE (TOWEL DISPOSABLE) ×2 IMPLANT
TOWEL GREEN STERILE FF (TOWEL DISPOSABLE) ×2 IMPLANT

## 2021-08-26 NOTE — H&P (Signed)
Patrick Munoz is an 35 y.o. male.   Chief Complaint: Umbilical hernia with pain HPI: Patient presents for repair of umbilical hernia with mesh.  He has been having some difficulty with control of his diabetes.  He has seen his primary care physician and made some adjustments.  He reports things are considerably better.  Continues to have pain at his umbilical hernia.  Past Medical History:  Diagnosis Date   ADHD (attention deficit hyperactivity disorder)    Back pain    Complication of anesthesia    Patient states he has been violent in the past after waking up from anesthsia   Diabetes mellitus without complication ([place])    Hypertension     Past Surgical History:  Procedure Laterality Date   BIOPSY  12/17/2019   Procedure: BIOPSY;  Surgeon: Juanita Craver, MD;  Location: [facility] ENDOSCOPY;  Service: Endoscopy;;   COLONOSCOPY N/A 12/17/2019   Procedure: COLONOSCOPY;  Surgeon: Juanita Craver, MD;  Location: [facility] ENDOSCOPY;  Service: Endoscopy;  Laterality: N/A;   POLYPECTOMY  12/17/2019   Procedure: POLYPECTOMY;  Surgeon: Juanita Craver, MD;  Location: [facility] ENDOSCOPY;  Service: Endoscopy;;   WISDOM TOOTH EXTRACTION Bilateral     Family History  Problem Relation Age of Onset   Colon cancer Maternal Grandfather    Stomach cancer Maternal Grandfather    Social History:  reports that he has been smoking cigars. His smokeless tobacco use includes snuff and chew. He reports current alcohol use. He reports that he does not use drugs.  Allergies:  Metformin PCN  Medications Prior to Admission  Medication Sig Dispense Refill   insulin aspart protamine- aspart (NOVOLOG MIX 70/30) (70-30) 100 UNIT/ML injection Inject 20 Units into the skin daily with supper.     insulin aspart protamine- aspart (NOVOLOG MIX 70/30) (70-30) 100 UNIT/ML injection Inject 10 Units into the skin daily with breakfast.     lisinopril (ZESTRIL) 5 MG tablet Take 5 mg by mouth daily.     mesalamine (LIALDA) 1.2 g EC tablet  Take 4.8 g by mouth daily.     acetaminophen (TYLENOL) 500 MG tablet Take 500-1,000 mg by mouth every 6 (six) hours as needed for moderate pain.     blood glucose meter kit and supplies Dispense based on patient and insurance preference. Use up to four times daily as directed. (FOR ICD-10 E10.9, E11.9). 1 each 0   HYDROcodone-acetaminophen (NORCO/VICODIN) 5-325 MG tablet Take 2 tablets by mouth every 4 (four) hours as needed. (Patient not taking: No sig reported) 10 tablet 0   Hyoscyamine Sulfate SL (LEVSIN/SL) 0.125 MG SUBL Place 1 each under the tongue 4 (four) times daily as needed for up to 5 days. (Patient not taking: Reported on 08/18/2021) 30 tablet 0   lidocaine (XYLOCAINE) 2 % solution Use as directed 15 mLs in the mouth or throat every 6 (six) hours as needed for mouth pain. (Patient not taking: No sig reported) 100 mL 0   omeprazole (PRILOSEC) 20 MG capsule Take 1 capsule (20 mg total) by mouth daily. (Patient not taking: No sig reported) 30 capsule 0   ondansetron (ZOFRAN ODT) 4 MG disintegrating tablet Take 1 tablet (4 mg total) by mouth every 8 (eight) hours as needed for nausea or vomiting. (Patient not taking: No sig reported) 8 tablet 0   Probiotic Product (UP4 PROBIOTICS ADULT) CAPS Take 1 capsule by mouth 3 (three) times daily before meals. Take 1 capsule by mouth 3 times daily before meals while having diarrhea (Patient  not taking: No sig reported) 20 capsule 0   TRULICITY 1.5 ER/8.4XQ SOPN Inject 1.5 mg into the skin once a week. Tuesdays (Patient not taking: Reported on 08/18/2021)      Results for orders placed or performed during the hospital encounter of 08/26/21 (from the past 48 hour(s))  Glucose, capillary     Status: Abnormal   Collection Time: 08/26/21 10:31 AM  Result Value Ref Range   Glucose-Capillary 209 (H) 70 - 99 mg/dL    Comment: Glucose reference range applies only to samples taken after fasting for at least 8 hours.   Comment 1 Notify RN   Glucose, capillary      Status: Abnormal   Collection Time: 08/26/21 11:35 AM  Result Value Ref Range   Glucose-Capillary 175 (H) 70 - 99 mg/dL    Comment: Glucose reference range applies only to samples taken after fasting for at least 8 hours.   Comment 1 Notify RN    No results found.  Review of Systems  Blood pressure (!) 154/104, pulse 63, temperature 97.6 F (36.4 C), temperature source Oral, resp. rate 18, height _0  (1.905 m), weight 134.3 kg, SpO2 98 %. Physical Exam Constitutional:      Appearance: Normal appearance.  HENT:     Head: Normocephalic.     Nose: Nose normal.     Mouth/Throat:     Mouth: Mucous membranes are moist.  Cardiovascular:     Rate and Rhythm: Normal rate and regular rhythm.  Pulmonary:     Effort: Pulmonary effort is normal.     Breath sounds: Normal breath sounds.  Abdominal:     General: Abdomen is flat.     Palpations: Abdomen is soft.     Hernia: A hernia is present.     Comments: Umbilical hernia is present which reduces on exam  Musculoskeletal:        General: Normal range of motion.     Cervical back: Normal range of motion.  Skin:    General: Skin is warm and dry.     Capillary Refill: Capillary refill takes less than 2 seconds.  Neurological:     Mental Status: He is alert and oriented to person, place, and time.  Psychiatric:        Mood and Affect: Mood normal.     Assessment/Plan Umbilical hernia -for umbilical hernia repair with mesh.  Procedure, risks, and benefits discussed in detail with him again.  He is agreeable.  He acknowledges increased risk in light of his difficult to control diabetes.  He has been working actively with his primary care physician to improve this.  Zenovia Jarred, MD 08/26/2021, 11:45 AM

## 2021-08-26 NOTE — Transfer of Care (Signed)
Immediate Anesthesia Transfer of Care Note  Patient: Patrick Munoz  Procedure(s) Performed: REPAIR UMBILICAL HERNIA WITH MESH (Abdomen) INSERTION OF MESH (Abdomen)  Patient Location: PACU  Anesthesia Type:General  Level of Consciousness: drowsy and patient cooperative  Airway & Oxygen Therapy: Patient Spontanous Breathing  Post-op Assessment: Report given to RN  Post vital signs: Reviewed and stable  Last Vitals:  Vitals Value Taken Time  BP 138/92 08/26/21 1316  Temp    Pulse 71 08/26/21 1317  Resp 23 08/26/21 1317  SpO2 97 % 08/26/21 1317  Vitals shown include unvalidated device data.  Last Pain:  Vitals:   08/26/21 1103  TempSrc:   PainSc: 0-No pain         Complications: No notable events documented.

## 2021-08-26 NOTE — Op Note (Signed)
  08/26/2021  1:09 PM  PATIENT:  Patrick Munoz  35 y.o. male  PRE-OPERATIVE DIAGNOSIS:  UMBILICAL HERNIA  POST-OPERATIVE DIAGNOSIS:  UMBILICAL HERNIA  PROCEDURE:  Procedure(s): REPAIR UMBILICAL HERNIA WITH MESH INSERTION OF MESH 6.4CM VENTRALEX  SURGEON:  Surgeon(s): Georganna Skeans, MD  ASSISTANTS: none   ANESTHESIA:   local and general  EBL:  Total I/O In: 47 [IV Piggyback:50] Out: -   BLOOD ADMINISTERED:none  DRAINS: none   SPECIMEN:  No Specimen  DISPOSITION OF SPECIMEN:  N/A  COUNTS:  YES  DICTATION: .Dragon Dictation Procedure in detail: Informed consent was obtained.  He received intravenous antibiotics.  He was brought to the operating room and general endotracheal anesthesia was administered by the anesthesia staff.  His abdomen was prepped and draped in a sterile fashion.  A timeout procedure was performed.  Local was injected around his umbilicus.  An infraumbilical incision was made.  Subcutaneous tissues were dissected down revealing the umbilical stalk.  This was circumferentially dissected and the umbilical skin was dissected up off of the hernia sac.  The hernia sac was identified.  It was excised and it contained only omentum.  The omental contents were freed up from the surrounding fascia carefully.  Hemostasis was obtained.  And the hernia reduced easily.  I then repaired the hernia with a 6.4 cm circular mesh.  The superior and inferior leaflet of the mesh were tacked to the fascia with interrupted 0 Prolene.  I then placed additional 0 Prolene sutures laterally on each side securing the fascia to the mesh.  It laid up very nicely as an underlay up against the fascia.  The area was irrigated.  Some additional local was injected.  Deep tissues and the umbilical stalk was tacked back down with 2-0 Vicryl.  Subcutaneous tissues were approximated with interrupted 3-0 Vicryl.  Skin was closed with running 4-0 Monocryl subcuticular followed by Dermabond.  All  counts were correct.  He tolerated the procedure well without apparent complication and was taken recovery in stable condition. PATIENT DISPOSITION:  PACU - hemodynamically stable.   Delay start of Pharmacological VTE agent (>24hrs) due to surgical blood loss or risk of bleeding:  no  Georganna Skeans, MD, MPH, FACS Pager: [phone]  11/4/20221:09 PM

## 2021-08-26 NOTE — Anesthesia Procedure Notes (Signed)
Procedure Name: Intubation Date/Time: 08/26/2021 12:28 PM Performed by: Barrington Ellison, CRNA Pre-anesthesia Checklist: Patient identified, Emergency Drugs available, Suction available and Patient being monitored Patient Re-evaluated:Patient Re-evaluated prior to induction Oxygen Delivery Method: Circle System Utilized Preoxygenation: Pre-oxygenation with 100% oxygen Induction Type: IV induction Ventilation: Mask ventilation without difficulty Laryngoscope Size: Mac and 4 Grade View: Grade I Tube type: Oral Tube size: 7.5 mm Number of attempts: 1 Airway Equipment and Method: Stylet and Oral airway Placement Confirmation: ETT inserted through vocal cords under direct vision, positive ETCO2 and breath sounds checked- equal and bilateral Secured at: 22 cm Tube secured with: Tape Dental Injury: Teeth and Oropharynx as per pre-operative assessment

## 2021-08-28 NOTE — Anesthesia Postprocedure Evaluation (Signed)
Anesthesia Post Note  Patient: Patrick Munoz  Procedure(s) Performed: REPAIR UMBILICAL HERNIA WITH MESH (Abdomen) INSERTION OF MESH (Abdomen)     Patient location during evaluation: PACU Anesthesia Type: General Level of consciousness: awake and alert and oriented Pain management: pain level controlled Vital Signs Assessment: post-procedure vital signs reviewed and stable Respiratory status: spontaneous breathing, nonlabored ventilation and respiratory function stable Cardiovascular status: blood pressure returned to baseline Postop Assessment: no apparent nausea or vomiting Anesthetic complications: no   No notable events documented.  Last Vitals:  Vitals:   08/26/21 1345 08/26/21 1400  BP: 119/83   Pulse: 69 87  Resp: 20 18  Temp:  (!) 36.1 C  SpO2: 98% 99%    Last Pain:  Vitals:   08/26/21 1415  TempSrc:   PainSc: Asleep                 Marthenia Rolling

## 2021-08-29 ENCOUNTER — Encounter (HOSPITAL_COMMUNITY): Payer: Self-pay | Admitting: General Surgery

## 2021-11-20 ENCOUNTER — Other Ambulatory Visit: Payer: Self-pay

## 2021-11-20 ENCOUNTER — Emergency Department (HOSPITAL_BASED_OUTPATIENT_CLINIC_OR_DEPARTMENT_OTHER)
Admission: EM | Admit: 2021-11-20 | Discharge: 2021-11-21 | Disposition: A | Discharge status: Home / Self Care | Payer: BC Managed Care – PPO | Attending: Emergency Medicine | Admitting: Emergency Medicine

## 2021-11-20 ENCOUNTER — Encounter (HOSPITAL_BASED_OUTPATIENT_CLINIC_OR_DEPARTMENT_OTHER): Payer: Self-pay | Admitting: Emergency Medicine

## 2021-11-20 ENCOUNTER — Emergency Department (HOSPITAL_BASED_OUTPATIENT_CLINIC_OR_DEPARTMENT_OTHER): Payer: BC Managed Care – PPO

## 2021-11-20 DIAGNOSIS — K8020 Calculus of gallbladder without cholecystitis without obstruction: Secondary | ICD-10-CM | POA: Diagnosis not present

## 2021-11-20 DIAGNOSIS — R1012 Left upper quadrant pain: Secondary | ICD-10-CM

## 2021-11-20 DIAGNOSIS — Z79899 Other long term (current) drug therapy: Secondary | ICD-10-CM | POA: Diagnosis not present

## 2021-11-20 DIAGNOSIS — K5900 Constipation, unspecified: Secondary | ICD-10-CM | POA: Insufficient documentation

## 2021-11-20 DIAGNOSIS — Z794 Long term (current) use of insulin: Secondary | ICD-10-CM | POA: Diagnosis not present

## 2021-11-20 DIAGNOSIS — E1165 Type 2 diabetes mellitus with hyperglycemia: Secondary | ICD-10-CM | POA: Insufficient documentation

## 2021-11-20 DIAGNOSIS — R739 Hyperglycemia, unspecified: Secondary | ICD-10-CM

## 2021-11-20 LAB — CBC WITH DIFFERENTIAL/PLATELET
Abs Immature Granulocytes: 0.05 10*3/uL (ref 0.00–0.07)
Basophils Absolute: 0.1 10*3/uL (ref 0.0–0.1)
Basophils Relative: 1 %
Eosinophils Absolute: 0.7 10*3/uL — ABNORMAL HIGH (ref 0.0–0.5)
Eosinophils Relative: 6 %
HCT: 47.8 % (ref 39.0–52.0)
Hemoglobin: 17.1 g/dL — ABNORMAL HIGH (ref 13.0–17.0)
Immature Granulocytes: 1 %
Lymphocytes Relative: 41 %
Lymphs Abs: 4.2 10*3/uL — ABNORMAL HIGH (ref 0.7–4.0)
MCH: 29.3 pg (ref 26.0–34.0)
MCHC: 35.8 g/dL (ref 30.0–36.0)
MCV: 81.8 fL (ref 80.0–100.0)
Monocytes Absolute: 0.7 10*3/uL (ref 0.1–1.0)
Monocytes Relative: 7 %
Neutro Abs: 4.6 10*3/uL (ref 1.7–7.7)
Neutrophils Relative %: 44 %
Platelets: 276 10*3/uL (ref 150–400)
RBC: 5.84 MIL/uL — ABNORMAL HIGH (ref 4.22–5.81)
RDW: 12.3 % (ref 11.5–15.5)
WBC: 10.3 10*3/uL (ref 4.0–10.5)
nRBC: 0 % (ref 0.0–0.2)

## 2021-11-20 LAB — COMPREHENSIVE METABOLIC PANEL
ALT: 23 U/L (ref 0–44)
AST: 12 U/L — ABNORMAL LOW (ref 15–41)
Albumin: 3.9 g/dL (ref 3.5–5.0)
Alkaline Phosphatase: 130 U/L — ABNORMAL HIGH (ref 38–126)
Anion gap: 10 (ref 5–15)
BUN: 17 mg/dL (ref 6–20)
CO2: 24 mmol/L (ref 22–32)
Calcium: 9.3 mg/dL (ref 8.9–10.3)
Chloride: 98 mmol/L (ref 98–111)
Creatinine, Ser: 0.77 mg/dL (ref 0.61–1.24)
GFR, Estimated: >60 mL/min (ref >60)
Glucose, Bld: 412 mg/dL — ABNORMAL HIGH (ref 70–99)
Potassium: 4.1 mmol/L (ref 3.5–5.1)
Sodium: 132 mmol/L — ABNORMAL LOW (ref 135–145)
Total Bilirubin: 0.4 mg/dL (ref 0.3–1.2)
Total Protein: 7 g/dL (ref 6.5–8.1)

## 2021-11-20 LAB — URINALYSIS, ROUTINE W REFLEX MICROSCOPIC
Bilirubin Urine: NEGATIVE
Glucose, UA: >1000 mg/dL — AB
Hgb urine dipstick: NEGATIVE
Ketones, ur: NEGATIVE mg/dL
Leukocytes,Ua: NEGATIVE
Nitrite: NEGATIVE
Protein, ur: NEGATIVE mg/dL
Specific Gravity, Urine: 1.039 — ABNORMAL HIGH (ref 1.005–1.030)
pH: 5.5 (ref 5.0–8.0)

## 2021-11-20 LAB — LIPASE, BLOOD: Lipase: 44 U/L (ref 11–51)

## 2021-11-20 MED ORDER — IOHEXOL 300 MG/ML  SOLN
100.0000 mL | Freq: Once | INTRAMUSCULAR | Status: AC | PRN
  Administered 2021-11-20: 100 mL via INTRAVENOUS

## 2021-11-20 MED ORDER — KETOROLAC TROMETHAMINE 30 MG/ML IJ SOLN
30.0000 mg | Freq: Once | INTRAMUSCULAR | Status: AC
  Administered 2021-11-20: 30 mg via INTRAVENOUS
  Filled 2021-11-20: qty 1

## 2021-11-20 MED ORDER — ALUM & MAG HYDROXIDE-SIMETH 200-200-20 MG/5ML PO SUSP
30.0000 mL | Freq: Once | ORAL | Status: AC
  Administered 2021-11-20: 30 mL via ORAL
  Filled 2021-11-20: qty 30

## 2021-11-20 MED ORDER — INSULIN ASPART 100 UNIT/ML IJ SOLN
2.0000 [IU] | INTRAMUSCULAR | Status: AC
  Administered 2021-11-20: 2 [IU] via SUBCUTANEOUS

## 2021-11-20 MED ORDER — ONDANSETRON HCL 4 MG/2ML IJ SOLN
4.0000 mg | Freq: Once | INTRAMUSCULAR | Status: AC
Start: 2021-11-20 — End: 2021-11-20
  Administered 2021-11-20: 4 mg via INTRAVENOUS
  Filled 2021-11-20: qty 2

## 2021-11-20 MED ORDER — SODIUM CHLORIDE 0.9 % IV BOLUS
500.0000 mL | Freq: Once | INTRAVENOUS | Status: AC
  Administered 2021-11-20: 500 mL via INTRAVENOUS

## 2021-11-20 MED ORDER — DICYCLOMINE HCL 20 MG PO TABS: 20.0000 mg | ORAL_TABLET | Freq: Two times a day (BID) | ORAL | 0 refills | Status: DC

## 2021-11-20 MED ORDER — DICYCLOMINE HCL 10 MG/ML IM SOLN
20.0000 mg | Freq: Once | INTRAMUSCULAR | Status: AC
  Administered 2021-11-21: 20 mg via INTRAMUSCULAR
  Filled 2021-11-20: qty 2

## 2021-11-20 MED ORDER — OMEPRAZOLE 20 MG PO CPDR: 20.0000 mg | DELAYED_RELEASE_CAPSULE | Freq: Every day | ORAL | 0 refills | Status: DC

## 2021-11-20 NOTE — ED Notes (Signed)
Patient transported to CT 

## 2021-11-20 NOTE — ED Triage Notes (Signed)
Left upper abd pain radiates to back, started yesterday worse today, worse after eating, denies any voming or diarrhea.

## 2021-11-21 ENCOUNTER — Encounter (HOSPITAL_BASED_OUTPATIENT_CLINIC_OR_DEPARTMENT_OTHER): Payer: Self-pay | Admitting: Emergency Medicine

## 2021-11-21 NOTE — ED Notes (Signed)
Water given to pt.  Pt tolerated well, no n/v

## 2021-11-21 NOTE — ED Provider Notes (Signed)
[facility] Provider Note   CSN: [id number] Arrival date & time: 11/20/21  2138     History{ Chief Complaint  Patient presents with   Abdominal Pain    Patrick Munoz is a 36 y.o. male.  The history is provided by the patient.  Abdominal Pain Pain location:  LUQ Pain quality: cramping   Pain radiates to:  L flank Pain severity:  Severe Onset quality:  Gradual Duration: days. Timing:  Constant Progression:  Unchanged Chronicity:  Recurrent Context: not eating, not laxative use, not suspicious food intake and not trauma   Relieved by:  Nothing Worsened by:  Nothing Ineffective treatments:  None tried Associated symptoms: constipation   Associated symptoms: no anorexia, no chest pain, no fever, no nausea and no vomiting   Risk factors: no NSAID use   Risk factors comment:  Noncompliance with medication regimen Patient with a h/o diabetes and GERD and colitis presents with LUQ into left flank pain.  No fevers, no vomiting, no diarrhea, some constipation, no urinary symptoms.  Wife reports there is a bulge that she and others can see there.      Home Medications Prior to Admission medications   Medication Sig Start Date End Date Taking? Authorizing Provider  dicyclomine (BENTYL) 20 MG tablet Take 1 tablet (20 mg total) by mouth 2 (two) times daily. 11/20/21  Yes Lurlean Kernen, MD  omeprazole (PRILOSEC) 20 MG capsule Take 1 capsule (20 mg total) by mouth daily. 11/20/21  Yes Ilse Billman, MD  acetaminophen (TYLENOL) 500 MG tablet Take 500-1,000 mg by mouth every 6 (six) hours as needed for moderate pain.    [provider]  blood glucose meter kit and supplies Dispense based on patient and insurance preference. Use up to four times daily as directed. (FOR ICD-10 E10.9, E11.9). 12/21/19   Arrien, Jimmy Picket, MD  Hyoscyamine Sulfate SL (LEVSIN/SL) 0.125 MG SUBL Place 1 each under the tongue 4 (four) times daily as needed for up to 5  days. Patient not taking: Reported on 08/18/2021 04/24/21 04/29/21  Fatima Blank, MD  insulin aspart protamine- aspart (NOVOLOG MIX 70/30) (70-30) 100 UNIT/ML injection Inject 20 Units into the skin daily with supper.    [provider]  insulin aspart protamine- aspart (NOVOLOG MIX 70/30) (70-30) 100 UNIT/ML injection Inject 10 Units into the skin daily with breakfast.    [provider]  lidocaine (XYLOCAINE) 2 % solution Use as directed 15 mLs in the mouth or throat every 6 (six) hours as needed for mouth pain. Patient not taking: No sig reported 04/24/21   Cardama, Grayce Sessions, MD  lisinopril (ZESTRIL) 5 MG tablet Take 5 mg by mouth daily.    [provider]  mesalamine (LIALDA) 1.2 g EC tablet Take 4.8 g by mouth daily. 05/26/21   [provider]  omeprazole (PRILOSEC) 20 MG capsule Take 1 capsule (20 mg total) by mouth daily. Patient not taking: No sig reported 04/24/21   Fatima Blank, MD  ondansetron (ZOFRAN ODT) 4 MG disintegrating tablet Take 1 tablet (4 mg total) by mouth every 8 (eight) hours as needed for nausea or vomiting. Patient not taking: No sig reported 01/03/20   Little, Wenda Overland, MD  Probiotic Product (UP4 PROBIOTICS ADULT) CAPS Take 1 capsule by mouth 3 (three) times daily before meals. Take 1 capsule by mouth 3 times daily before meals while having diarrhea Patient not taking: No sig reported 01/03/20   Little, Wenda Overland, MD  TRULICITY 1.5 KJ/1.7HX SOPN Inject 1.5 mg into the skin once a week. Tuesdays Patient not taking: Reported on 08/18/2021 11/28/19   [provider]      Allergies    Metformin and related and Penicillins    Review of Systems   Review of Systems  Constitutional:  Negative for fever.  HENT:  Negative for facial swelling.   Eyes:  Negative for redness.  Respiratory:  Negative for wheezing and stridor.   Cardiovascular:  Negative for chest pain.  Gastrointestinal:  Positive for abdominal  pain and constipation. Negative for anorexia, nausea and vomiting.  Skin:  Negative for rash.  Neurological:  Negative for facial asymmetry.  All other systems reviewed and are negative.  Physical Exam Updated Vital Signs BP (!) 149/77 (BP Location: Right Arm)    Pulse 76    Temp 98.1 F (36.7 C) (Oral)    Resp 20    Ht 6' 3"  (1.905 m)    Wt 135.6 kg    SpO2 98%    BMI 37.37 kg/m  Physical Exam Vitals and nursing note reviewed. Exam conducted with a chaperone present.  Constitutional:      General: He is not in acute distress.    Appearance: Normal appearance.  HENT:     Head: Normocephalic and atraumatic.     Nose: Nose normal.  Eyes:     Conjunctiva/sclera: Conjunctivae normal.     Pupils: Pupils are equal, round, and reactive to light.  Cardiovascular:     Rate and Rhythm: Normal rate and regular rhythm.     Pulses: Normal pulses.     Heart sounds: Normal heart sounds.  Pulmonary:     Effort: Pulmonary effort is normal.     Breath sounds: Normal breath sounds.  Abdominal:     General: Bowel sounds are normal.     Palpations: Abdomen is soft.     Tenderness: There is no abdominal tenderness. There is no guarding or rebound. Negative signs include Murphy's sign, Rovsing's sign and McBurney's sign.     Hernia: No hernia is present.     Comments: No visible bulge on exam   Musculoskeletal:        General: Normal range of motion.     Cervical back: Normal range of motion and neck supple.  Skin:    General: Skin is warm and dry.     Capillary Refill: Capillary refill takes less than 2 seconds.  Neurological:     General: No focal deficit present.     Mental Status: He is alert and oriented to person, place, and time.     Deep Tendon Reflexes: Reflexes normal.  Psychiatric:        Mood and Affect: Mood normal.        Behavior: Behavior normal.    ED Results / Procedures / Treatments   Labs (all labs ordered are listed, but only abnormal results are displayed) Results  for orders placed or performed during the hospital encounter of 11/20/21  Comprehensive metabolic panel  Result Value Ref Range   Sodium 132 (L) 135 - 145 mmol/L   Potassium 4.1 3.5 - 5.1 mmol/L   Chloride 98 98 - 111 mmol/L   CO2 24 22 - 32 mmol/L   Glucose, Bld 412 (H) 70 - 99 mg/dL   BUN 17 6 - 20 mg/dL   Creatinine, Ser 0.77 0.61 - 1.24 mg/dL   Calcium 9.3 8.9 - 10.3 mg/dL   Total Protein 7.0 6.5 -  8.1 g/dL   Albumin 3.9 3.5 - 5.0 g/dL   AST 12 (L) 15 - 41 U/L   ALT 23 0 - 44 U/L   Alkaline Phosphatase 130 (H) 38 - 126 U/L   Total Bilirubin 0.4 0.3 - 1.2 mg/dL   GFR, Estimated >60 >60 mL/min   Anion gap 10 5 - 15  Lipase, blood  Result Value Ref Range   Lipase 44 11 - 51 U/L  CBC with Diff  Result Value Ref Range   WBC 10.3 4.0 - 10.5 K/uL   RBC 5.84 (H) 4.22 - 5.81 MIL/uL   Hemoglobin 17.1 (H) 13.0 - 17.0 g/dL   HCT 47.8 39.0 - 52.0 %   MCV 81.8 80.0 - 100.0 fL   MCH 29.3 26.0 - 34.0 pg   MCHC 35.8 30.0 - 36.0 g/dL   RDW 12.3 11.5 - 15.5 %   Platelets 276 150 - 400 K/uL   nRBC 0.0 0.0 - 0.2 %   Neutrophils Relative % 44 %   Neutro Abs 4.6 1.7 - 7.7 K/uL   Lymphocytes Relative 41 %   Lymphs Abs 4.2 (H) 0.7 - 4.0 K/uL   Monocytes Relative 7 %   Monocytes Absolute 0.7 0.1 - 1.0 K/uL   Eosinophils Relative 6 %   Eosinophils Absolute 0.7 (H) 0.0 - 0.5 K/uL   Basophils Relative 1 %   Basophils Absolute 0.1 0.0 - 0.1 K/uL   Immature Granulocytes 1 %   Abs Immature Granulocytes 0.05 0.00 - 0.07 K/uL  Urinalysis, Routine w reflex microscopic Urine, Clean Catch  Result Value Ref Range   Color, Urine COLORLESS (A) YELLOW   APPearance CLEAR CLEAR   Specific Gravity, Urine 1.039 (H) 1.005 - 1.030   pH 5.5 5.0 - 8.0   Glucose, UA >1,000 (A) NEGATIVE mg/dL   Hgb urine dipstick NEGATIVE NEGATIVE   Bilirubin Urine NEGATIVE NEGATIVE   Ketones, ur NEGATIVE NEGATIVE mg/dL   Protein, ur NEGATIVE NEGATIVE mg/dL   Nitrite NEGATIVE NEGATIVE   Leukocytes,Ua NEGATIVE NEGATIVE    CT ABDOMEN PELVIS W CONTRAST  Result Date: 11/20/2021 CLINICAL DATA:  Abdominal pain, acute, nonlocalized EXAM: CT ABDOMEN AND PELVIS WITH CONTRAST TECHNIQUE: Multidetector CT imaging of the abdomen and pelvis was performed using the standard protocol following bolus administration of intravenous contrast. RADIATION DOSE REDUCTION: This exam was performed according to the departmental dose-optimization program which includes automated exposure control, adjustment of the mA and/or kV according to patient size and/or use of iterative reconstruction technique. CONTRAST:  120m OMNIPAQUE IOHEXOL 300 MG/ML  SOLN COMPARISON:  CT abdomen pelvis 04/24/2021 FINDINGS: Lower chest: No acute abnormality. Hepatobiliary: No focal liver abnormality. The gallbladder is contracted. Hyperdensity within the gallbladder lumen consistent with cholelithiasis. No gallbladder wall thickening or pericholecystic fluid. No biliary dilatation. Pancreas: No focal lesion. Normal pancreatic contour. No surrounding inflammatory changes. No main pancreatic ductal dilatation. Spleen: Normal in size without focal abnormality. Adrenals/Urinary Tract: No adrenal nodule bilaterally. Bilateral kidneys enhance symmetrically. No hydronephrosis. No hydroureter. The urinary bladder is unremarkable. Stomach/Bowel: Stomach is within normal limits. No evidence of bowel wall thickening or dilatation. Appendix appears normal. Vascular/Lymphatic: Phleboliths within the pelvis. No abdominal aorta or iliac aneurysm. No abdominal, pelvic, or inguinal lymphadenopathy. Reproductive: Prostate is unremarkable. Other: No intraperitoneal free fluid. No intraperitoneal free gas. No organized fluid collection. Musculoskeletal: No abdominal wall hernia or abnormality. No suspicious lytic or blastic osseous lesions. No acute displaced fracture. Nonspecific slightly asymmetric right ischial tuberosity right to left. IMPRESSION:  1. No acute intra-abdominal or intrapelvic  abnormality. 2. Cholelithiasis with no CT findings of acute cholecystitis. Electronically Signed   By: Iven Finn M.D.   On: 11/20/2021 23:42     Radiology CT ABDOMEN PELVIS W CONTRAST  Result Date: 11/20/2021 CLINICAL DATA:  Abdominal pain, acute, nonlocalized EXAM: CT ABDOMEN AND PELVIS WITH CONTRAST TECHNIQUE: Multidetector CT imaging of the abdomen and pelvis was performed using the standard protocol following bolus administration of intravenous contrast. RADIATION DOSE REDUCTION: This exam was performed according to the departmental dose-optimization program which includes automated exposure control, adjustment of the mA and/or kV according to patient size and/or use of iterative reconstruction technique. CONTRAST:  181m OMNIPAQUE IOHEXOL 300 MG/ML  SOLN COMPARISON:  CT abdomen pelvis 04/24/2021 FINDINGS: Lower chest: No acute abnormality. Hepatobiliary: No focal liver abnormality. The gallbladder is contracted. Hyperdensity within the gallbladder lumen consistent with cholelithiasis. No gallbladder wall thickening or pericholecystic fluid. No biliary dilatation. Pancreas: No focal lesion. Normal pancreatic contour. No surrounding inflammatory changes. No main pancreatic ductal dilatation. Spleen: Normal in size without focal abnormality. Adrenals/Urinary Tract: No adrenal nodule bilaterally. Bilateral kidneys enhance symmetrically. No hydronephrosis. No hydroureter. The urinary bladder is unremarkable. Stomach/Bowel: Stomach is within normal limits. No evidence of bowel wall thickening or dilatation. Appendix appears normal. Vascular/Lymphatic: Phleboliths within the pelvis. No abdominal aorta or iliac aneurysm. No abdominal, pelvic, or inguinal lymphadenopathy. Reproductive: Prostate is unremarkable. Other: No intraperitoneal free fluid. No intraperitoneal free gas. No organized fluid collection. Musculoskeletal: No abdominal wall hernia or abnormality. No suspicious lytic or blastic osseous  lesions. No acute displaced fracture. Nonspecific slightly asymmetric right ischial tuberosity right to left. IMPRESSION: 1. No acute intra-abdominal or intrapelvic abnormality. 2. Cholelithiasis with no CT findings of acute cholecystitis. Electronically Signed   By: MIven FinnM.D.   On: 11/20/2021 23:42      Medications Ordered in ED Medications  ketorolac (TORADOL) 30 MG/ML injection 30 mg (30 mg Intravenous Given 11/20/21 2316)  ondansetron (ZOFRAN) injection 4 mg (4 mg Intravenous Given 11/20/21 2315)  sodium chloride 0.9 % bolus 500 mL (500 mLs Intravenous New Bag/Given 11/20/21 2318)  iohexol (OMNIPAQUE) 300 MG/ML solution 100 mL (100 mLs Intravenous Contrast Given 11/20/21 2323)  insulin aspart (novoLOG) injection 2 Units (2 Units Subcutaneous Given 11/20/21 2359)  alum & mag hydroxide-simeth (MAALOX/MYLANTA) 200-200-20 MG/5ML suspension 30 mL (30 mLs Oral Given 11/20/21 2358)  dicyclomine (BENTYL) injection 20 mg (20 mg Intramuscular Given 11/21/21 0000)    ED Course/ Medical Decision Making/ A&P    Wife is demanding MRI of the body stating that EDP "does not know what you are doing even CT can she the bulge.  Something is wrong or he wouldn't be in pain."  Wife is verbally aggressive with EDP.  EDP apologized for their wait (though they were in the department 2 hours only at the time I reassessed post CT).  She repeatedly stated, as I reviewed the plan of care which included insulin as sugar was high, that that is "not his problem, his doctor knows he is not on his medication and diabetes does not cause pain."  She again wants to be sent somewhere for an MRI.  EDP politely explained MRI is not the test for abdominal pain of this nature.  EDP attempted to educate patient on GERD, and gastritis and gastroparesis.  Wife repeated stated that is not the problem and that EDP does not understand the problem.  EDP politely states that a negative CT scan does  not mean the patient is not in pain, it  excludes life threatening illnesses that require surgery, hospitalization and and antibiotics.  EDP continued that no colitis, perforated ulcers, or surgical issues were found on today's CT scan.  The wife continues to argue with EDP.  EDP stated that the patient should follow up with both his PMD and gastroenterologist.  EDP stated we would restart the patient's PPI (omeprazole) for GERD and an anti-spasmotic.  Wife repeatedly states I was wrong and made hand gestured waving off EDP.  EDP politely excused herself.  Nurse is present giving additional medications.                           Medical Decision Making Patient with DM and colitis and GERD who has LUQ into Left flank pain.  No f/c/r. No vomiting no diarrhea, no urinary symptoms.    Amount and/or Complexity of Data Reviewed Independent Historian: spouse    Details: Wife reports patient is not taking his medications but reports " this doctors know and hid diabetes is not the issue."  She also reports "We've been here more than 3 hours and nothing has been done." Labs: ordered. Radiology: ordered.  Risk OTC drugs. Prescription drug management. Risk Details: I considered hospitalization given patient's past medical history.  EDP ordered a CT with contrast to exclude colitis/ diverticulitis/ perforated ulcer and other conditions.  The CT scan showed no acute issues.  Exam, vitals and labs are benign ans reassuring.  I personally reviewed images and there are no hernias.  No bulge seen on imaging.  There are no bowel obstructions or perforation.  Given normal exam, vitals labs and imaging, I do not believe hospitalization is indicated.  I suspect this is gastritis/GERD as patient is not on any of his medications.  I also suspect a component of gastroparesis as patient is non-compliant with his diabetic regimen.  Therefore, Diabetes I believe does contribute in part to his pain.  I have instructed close follow up with both PMD to restart his diabetes  regimen and GI as they can perform endoscopy.   The wife has waved me off and turned her head and is muttering under her breath so I have also included this information on the discharge paperwork.  GI contact information highlighted.  RX for PPI and anti-spasmotic sent to patient's pharmacy.        Final Clinical Impression(s) / ED Diagnoses Final diagnoses:  Hyperglycemia  LUQ pain     Return for intractable cough, coughing up blood, fevers > 100.4 unrelieved by medication, shortness of breath, intractable vomiting, chest pain, shortness of breath, weakness, numbness, changes in speech, facial asymmetry, abdominal pain, passing out, Inability to tolerate liquids or food, cough, altered mental status or any concerns. No signs of systemic illness or infection. The patient is nontoxic-appearing on exam and vital signs are within normal limits.  I have reviewed the triage vital signs and the nursing notes. Pertinent labs & imaging results that were available during my care of the patient were reviewed by me and considered in my medical decision making (see chart for details). After history, exam, and medical workup I feel the patient has been appropriately medically screened and is safe for discharge home. Pertinent diagnoses were discussed with the patient. Patient was given return precautions.        Rx / DC Orders ED Discharge Orders          Ordered  dicyclomine (BENTYL) 20 MG tablet  2 times daily        11/20/21 2357    omeprazole (PRILOSEC) 20 MG capsule  Daily        11/20/21 2357              Khaniyah Bezek, MD 11/21/21 5271

## 2021-11-22 NOTE — Progress Notes (Signed)
Cardiology Office Note:    Date:  11/24/2021   ID:  Patrick [facility]oz, DOB Jul 10, 1986, MRN [id number]  PCP:  [facility], [place] [facility] Cardiologist:  Lenna Sciara, MD Referring MD: Patrick Munoz, [place]   Chief Complaint/Reason for Referral: Chest pain and shortness of breath  ASSESSMENT:    Chest pain, unspecified type - Plan: EKG 12-Lead, Lipid panel, Hepatic function panel  Dyspnea, unspecified type - Plan: EKG 12-Lead, ECHOCARDIOGRAM COMPLETE  Type 2 diabetes mellitus without complication, without long-term current use of insulin (HCC)  Hyperlipidemia, unspecified hyperlipidemia type  Hypertension, unspecified type - Plan: EKG 12-Lead  BMI 39.0-39.9,adult    PLAN:    In order of problems listed above:  1.  The patient's chest pain is reproducible with palpation.  I do think this is due to musculoskeletal issue from straining himself from work..  We will monitor for now.  Follow-up in 3 months.  2.  We will obtain an echocardiogram to evaluate further.  3.  Restart aspirin 81, atorvastatin 40, and lisinopril 10 mg.  We will start pantoprazole given his history of GERD and ulcerative colitis.  We will plan on checking lipid panel and LFTs in 3 months.  We will consider starting Jardiance next time we see him.  4.  See discussion above.  5.  Restart lisinopril 10 mg daily.  6.  Will consider referring to pharmacy recommendations regarding pharmaceutical therapy for obesity and hyperlipidemia management after next visit.              Dispo:  Return in about 3 months (around 02/21/2022).     Medication Adjustments/Labs and Tests Ordered: Current medicines are reviewed at length with the patient today.  Concerns regarding medicines are outlined above.   Tests Ordered: Orders Placed This Encounter  Procedures   Lipid panel   Hepatic function panel   EKG 12-Lead   ECHOCARDIOGRAM COMPLETE    Medication Changes: Meds ordered this  encounter  Medications   atorvastatin (LIPITOR) 40 MG tablet    Sig: Take 1 tablet (40 mg total) by mouth daily.    Dispense:  90 tablet    Refill:  3   aspirin EC 81 MG tablet    Sig: Take 1 tablet (81 mg total) by mouth daily. Swallow whole.    History of Present Illness:    FOCUSED CARDIOVASCULAR PROBLEM LIST:   1.  Type 2 diabetes 2.  Hypertension 3.  Hyperlipidemia 4.  Ulcerative colitis 5.  Elevated BMI   The patient is a 36 y.o. male with the indicated medical history here for recommendations regarding chest pain.  The patient tells me that he has had chest pain over the last few days.  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  He tells me that he developed chest pain last night that has been constant.  He also has noticed more shortness of breath when he walks.  Shortness of breath.  When he rests.  He denies any presyncope, palpitations, paroxysmal nocturnal dyspnea, orthopnea.  He does snore and does report daytime somnolence.  He was seen in the emergency department due to left upper quadrant pain thought to be due to GERD.  This pain has improved.  Of note he underwent hernia surgery in November that was uncomplicated.  This was done under general anesthesia.  He has been off all medications due to financial issues since that  time.        Previous Medical History: Past Medical History:  Diagnosis Date   ADHD (attention deficit hyperactivity disorder)    Back pain    Complication of anesthesia    Patient states he has been violent in the past after waking up from anesthsia   Diabetes mellitus without complication (HCC)    Hypertension      Current Medications: Current Meds  Medication Sig   aspirin EC 81 MG tablet Take 1 tablet (81 mg total) by mouth daily. Swallow whole.   atorvastatin (LIPITOR) 40 MG tablet Take 1 tablet (40 mg total) by mouth daily.     Allergies:    Metformin and  related and Penicillins   Social History:   Social History   Tobacco Use   Smoking status: Some Days    Types: Cigars   Smokeless tobacco: Current    Types: Snuff, Chew  Vaping Use   Vaping Use: Never used  Substance Use Topics   Alcohol use: Yes    Comment: social   Drug use: No     Family Hx: Family History  Problem Relation Age of Onset   Colon cancer Maternal Grandfather    Stomach cancer Maternal Grandfather      Review of Systems:   Please see the history of present illness.    All other systems reviewed and are negative.     EKGs/Labs/Other Test Reviewed:    EKG: Sinus tachycardia with LVH and strain  Prior CV studies: None available    Imaging studies that I have independently reviewed today: CT pelvis  Recent Labs: 11/20/2021: ALT 23; BUN 17; Creatinine, Ser 0.77; Hemoglobin 17.1; Platelets 276; Potassium 4.1; Sodium 132   Recent Lipid Panel No results found for: CHOL, TRIG, HDL, LDLCALC, LDLDIRECT  Risk Assessment/Calculations:          Physical Exam:    VS:  BP (!) 176/90    Pulse 100    Ht 6' 2"  (1.88 m)    Wt (!) 305 lb (138.3 kg)    SpO2 95%    BMI 39.16 kg/m    Wt Readings from Last 3 Encounters:  11/24/21 (!) 305 lb (138.3 kg)  11/20/21 299 lb (135.6 kg)  08/26/21 296 lb (134.3 kg)    GENERAL:  No apparent distress, AOx3 HEENT:  No carotid bruits, +2 carotid impulses, no scleral icterus CAR: RRR Irregular no murmurs, gallops, rubs, or thrills RES:  Clear to auscultation bilaterally ABD:  Soft, nontender, nondistended, positive bowel sounds x 4 VASC:  +2 radial pulses, +2 carotid pulses, palpable pedal pulses NEURO:  CN 2-12 grossly intact; motor and sensory grossly intact PSYCH:  No active depression or anxiety EXT:  No edema, ecchymosis, or cyanosis  Signed, Early Osmond, MD  11/24/2021 10:19 AM    [address], [place], [place]  [address] Phone: [phone]; Fax: [phone]   Note:   This document was prepared using Dragon voice recognition software and may include unintentional dictation errors.

## 2021-11-24 ENCOUNTER — Other Ambulatory Visit: Payer: Self-pay

## 2021-11-24 ENCOUNTER — Encounter: Payer: Self-pay | Admitting: Internal Medicine

## 2021-11-24 ENCOUNTER — Ambulatory Visit: Payer: BC Managed Care – PPO | Admitting: Internal Medicine

## 2021-11-24 VITALS — BP 176/90 | HR 100 | Ht 74.0 in | Wt 305.0 lb

## 2021-11-24 DIAGNOSIS — Z6839 Body mass index (BMI) 39.0-39.9, adult: Secondary | ICD-10-CM

## 2021-11-24 DIAGNOSIS — R0600 Dyspnea, unspecified: Secondary | ICD-10-CM

## 2021-11-24 DIAGNOSIS — E785 Hyperlipidemia, unspecified: Secondary | ICD-10-CM | POA: Diagnosis not present

## 2021-11-24 DIAGNOSIS — I10 Essential (primary) hypertension: Secondary | ICD-10-CM | POA: Diagnosis not present

## 2021-11-24 DIAGNOSIS — R079 Chest pain, unspecified: Secondary | ICD-10-CM

## 2021-11-24 DIAGNOSIS — E119 Type 2 diabetes mellitus without complications: Secondary | ICD-10-CM | POA: Diagnosis not present

## 2021-11-24 MED ORDER — LISINOPRIL 10 MG PO TABS: 10.0000 mg | ORAL_TABLET | Freq: Every day | ORAL | 3 refills | Status: DC

## 2021-11-24 MED ORDER — ASPIRIN EC 81 MG PO TBEC: 81.0000 mg | DELAYED_RELEASE_TABLET | Freq: Every day | ORAL | Status: DC

## 2021-11-24 MED ORDER — ATORVASTATIN CALCIUM 40 MG PO TABS: 40.0000 mg | ORAL_TABLET | Freq: Every day | ORAL | 3 refills | Status: DC

## 2021-11-24 NOTE — Patient Instructions (Signed)
Medication Instructions:  START ASA  81 MG EVERY DAY  ATORVASTATIN 40 MG AT BEDTIME  LISINOPRIL 10 MG EVERY DAY  PANTOPRAZOLE 40 MG EVERY DAY  *If you need a refill on your cardiac medications before your next appointment, please call your pharmacy*   Lab Work: 3 MONTHS FASTING LIPID AND LIVER  If you have labs (blood work) drawn today and your tests are completely normal, you will receive your results only by: [place] (if you have MyChart) OR A paper copy in the mail If you have any lab test that is abnormal or we need to change your treatment, we will call you to review the results.   Testing/Procedures: Your physician has requested that you have an echocardiogram. Echocardiography is a painless test that uses sound waves to create images of your heart. It provides your doctor with information about the size and shape of your heart and how well your hearts chambers and valves are working. This procedure takes approximately one hour. There are no restrictions for this procedure.    Follow-Up: At [facility], you and your health needs are our priority.  As part of our continuing mission to provide you with exceptional heart care, we have created designated Provider Care Teams.  These Care Teams include your primary Cardiologist (physician) and Advanced Practice Providers (APPs -  Physician Assistants and Nurse Practitioners) who all work together to provide you with the care you need, when you need it.  We recommend signing up for the patient portal called "MyChart".  Sign up information is provided on this After Visit Summary.  MyChart is used to connect with patients for Virtual Visits (Telemedicine).  Patients are able to view lab/test results, encounter notes, upcoming appointments, etc.  Non-urgent messages can be sent to your provider as well.   To learn more about what you can do with MyChart, go to [email or web address].    Your next appointment:   3 month(s)  The  format for your next appointment:   In Person  Provider:   DR Ali Lowe  If primary card or EP is not listed click here to update    :1}    Other Instructions

## 2021-11-30 ENCOUNTER — Ambulatory Visit (HOSPITAL_COMMUNITY): Payer: BC Managed Care – PPO | Attending: Cardiology

## 2021-11-30 ENCOUNTER — Other Ambulatory Visit: Payer: Self-pay

## 2021-11-30 DIAGNOSIS — R0600 Dyspnea, unspecified: Secondary | ICD-10-CM | POA: Insufficient documentation

## 2021-11-30 DIAGNOSIS — R0609 Other forms of dyspnea: Secondary | ICD-10-CM

## 2021-11-30 LAB — ECHOCARDIOGRAM COMPLETE
Area-P 1/2: 2.67 cm2
S' Lateral: 2.9 cm

## 2021-12-01 ENCOUNTER — Telehealth: Payer: Self-pay | Admitting: Internal Medicine

## 2021-12-01 NOTE — Telephone Encounter (Signed)
Patient's wife Elmyra Ricks called for his echo results.  You can leave her detailed on voicemail at work she is the only that checks that voicemail.

## 2021-12-01 NOTE — Telephone Encounter (Signed)
Spoke to pt, results given to patient. Patient asks we call wife to inform her as well. Wife called and also informed of result. Advised to fill out a DPR next time pt is in office allowing [place] to speak w/ wife about pt care/results. Patient & wife verbalized understanding and agreeable to plan.

## 2022-02-16 NOTE — Progress Notes (Signed)
?Cardiology Office Note:   ? ?Date:  02/16/2022  ? ?ID:  Patrick Munoz, DOB 1986/02/02, MRN [id number] ? ?PCP:  Scheryl Marten, PA  ? ?[facility] Providers ?Cardiologist:  Lenna Sciara, MD ?Referring MD: Scheryl Marten, PA  ? ?Chief Complaint/Reason for Referral: PATIENT NO SHOW ? ?ASSESSMENT:   ? ?1. Type 2 diabetes mellitus without complication, without long-term current use of insulin ([place])   ?2. Hypertension associated with diabetes ([place])   ?3. Hyperlipidemia associated with type 2 diabetes mellitus ([place])   ?4. BMI 39.0-39.9,adult   ? ? ?PLAN:   ? ?In order of problems listed above: ?1.  Type 2 diabetes: Continue aspirin, lisinopril, and atorvastatin.  We will start Jardiance 10 mg daily. ?2.  Hypertension: Continue lisinopril ?3.  Hyperlipidemia: We will check lipid panel and LFTs today with goal LDL less than 70. ?4.  Elevated BMI: We will refer to pharmacy for recommendations. ? ? ?     ? ?  ? ?Dispo:  No follow-ups on file.  ? ?  ? ?Medication Adjustments/Labs and Tests Ordered: ?Current medicines are reviewed at length with the patient today.  Concerns regarding medicines are outlined above. ? ?The following changes have been made:    ? ?Labs/tests ordered: ?No orders of the defined types were placed in this encounter. ? ? ?Medication Changes: ?No orders of the defined types were placed in this encounter. ? ? ? ?Current medicines are reviewed at length with the patient today.  The patient  concerns regarding medicines. ? ? ?History of Present Illness:   ? ?FOCUSED PROBLEM LIST:   ?1.  Type 2 diabetes ?2.  Hypertension ?3.  Hyperlipidemia ?4.  Ulcerative colitis ?5.  BMI 39 ? ?The patient is a 36 y.o. male with the indicated medical history here for routine follow-up. ? ?February 2023: The patient was seen in consultation due to chest pain.  It was thought to be reproducible with palpation so no ischemic evaluation was pursued.  Due to reports of dyspnea an echocardiogram was obtained and  the results were reassuring.  Due to history of diabetes he was started on aspirin, atorvastatin, and lisinopril with plans to perhaps start Jardiance in the future. ? ?Today: ? ?   ?  ? ? ?Current Medications: ?No outpatient medications have been marked as taking for the 02/22/22 encounter (Appointment) with Early Osmond, MD.  ?  ? ?Allergies:    ?Metformin and related and Penicillins  ? ?Social History:   ?Social History  ? ?Tobacco Use  ? Smoking status: Some Days  ?  Types: Cigars  ? Smokeless tobacco: Current  ?  Types: Snuff, Chew  ?Vaping Use  ? Vaping Use: Never used  ?Substance Use Topics  ? Alcohol use: Yes  ?  Comment: social  ? Drug use: No  ?  ? ?Family Hx: ?Family History  ?Problem Relation Age of Onset  ? Colon cancer Maternal Grandfather   ? Stomach cancer Maternal Grandfather   ?  ? ?Review of Systems:   ?Please see the history of present illness.    ?All other systems reviewed and are negative. ?  ? ? ?EKGs/Labs/Other Test Reviewed:   ? ?EKG:  EKG performed February 2023 that I personally reviewed demonstrates sinus tachycardia with LVH; EKG performed today that I personally reviewed demonstrates . ? ?Prior CV studies: ? ? ? ?Other studies Reviewed: ?Review of the additional studies/records demonstrates: CT abdomen pelvis 2023 without aortic atherosclerosis or aneurysm ? ?Recent  Labs: ?11/20/2021: ALT 23; BUN 17; Creatinine, Ser 0.77; Hemoglobin 17.1; Platelets 276; Potassium 4.1; Sodium 132  ? ?Recent Lipid Panel ?No results found for: CHOL, TRIG, HDL, LDLCALC, LDLDIRECT ? ?Risk Assessment/Calculations:   ? ? ?    ? ?Physical Exam:   ? ? ? ?Signed, ?Early Osmond, MD  ?02/16/2022 8:43 AM    ?[facility] [address], [place], [place]  [id number] ?Phone: [phone]; Fax: [phone]  ? ?Note:  This document was prepared using Dragon voice recognition software and may include unintentional dictation errors. ?

## 2022-02-22 ENCOUNTER — Ambulatory Visit (INDEPENDENT_AMBULATORY_CARE_PROVIDER_SITE_OTHER): Payer: BC Managed Care – PPO | Admitting: Internal Medicine

## 2022-02-22 DIAGNOSIS — E119 Type 2 diabetes mellitus without complications: Secondary | ICD-10-CM

## 2022-02-22 DIAGNOSIS — E1169 Type 2 diabetes mellitus with other specified complication: Secondary | ICD-10-CM

## 2022-02-22 DIAGNOSIS — Z6839 Body mass index (BMI) 39.0-39.9, adult: Secondary | ICD-10-CM

## 2022-02-22 DIAGNOSIS — E785 Hyperlipidemia, unspecified: Secondary | ICD-10-CM

## 2022-02-22 DIAGNOSIS — I152 Hypertension secondary to endocrine disorders: Secondary | ICD-10-CM

## 2022-02-22 DIAGNOSIS — E1159 Type 2 diabetes mellitus with other circulatory complications: Secondary | ICD-10-CM

## 2022-02-24 ENCOUNTER — Encounter: Payer: Self-pay | Admitting: Internal Medicine

## 2022-08-28 ENCOUNTER — Emergency Department (HOSPITAL_BASED_OUTPATIENT_CLINIC_OR_DEPARTMENT_OTHER)
Admission: EM | Admit: 2022-08-28 | Discharge: 2022-08-29 | Disposition: A | Discharge status: Home / Self Care | Payer: BC Managed Care – PPO | Attending: Emergency Medicine | Admitting: Emergency Medicine

## 2022-08-28 ENCOUNTER — Encounter (HOSPITAL_BASED_OUTPATIENT_CLINIC_OR_DEPARTMENT_OTHER): Payer: Self-pay

## 2022-08-28 ENCOUNTER — Other Ambulatory Visit: Payer: Self-pay

## 2022-08-28 DIAGNOSIS — J029 Acute pharyngitis, unspecified: Secondary | ICD-10-CM | POA: Diagnosis present

## 2022-08-28 DIAGNOSIS — I10 Essential (primary) hypertension: Secondary | ICD-10-CM | POA: Insufficient documentation

## 2022-08-28 DIAGNOSIS — Z7982 Long term (current) use of aspirin: Secondary | ICD-10-CM | POA: Diagnosis not present

## 2022-08-28 DIAGNOSIS — E119 Type 2 diabetes mellitus without complications: Secondary | ICD-10-CM | POA: Diagnosis not present

## 2022-08-28 DIAGNOSIS — J020 Streptococcal pharyngitis: Secondary | ICD-10-CM | POA: Diagnosis not present

## 2022-08-28 LAB — GROUP A STREP BY PCR: Group A Strep by PCR: DETECTED — AB

## 2022-08-28 MED ORDER — CLINDAMYCIN HCL 150 MG PO CAPS
300.0000 mg | ORAL_CAPSULE | Freq: Once | ORAL | Status: AC
  Administered 2022-08-28: 300 mg via ORAL
  Filled 2022-08-28: qty 2

## 2022-08-28 MED ORDER — CLINDAMYCIN HCL 300 MG PO CAPS: 300.0000 mg | ORAL_CAPSULE | Freq: Three times a day (TID) | ORAL | 0 refills | Status: DC

## 2022-08-28 NOTE — ED Notes (Signed)
[place] ice given with po meds; swallowing capsules irritated throat per pt., so pt. Refused food at this time.

## 2022-08-28 NOTE — ED Provider Notes (Signed)
[facility] EMERGENCY DEPT Provider Note   CSN: [id number] Arrival date & time: 08/28/22  1759     History  Chief Complaint  Patient presents with   Sore Throat    Patrick Munoz is a 36 y.o. male.   Sore Throat   36 year old male presents emergency department with complaints of sore throat.  Patient states that sore throat began yesterday.  He notes pain bilaterally with radiation to his left ear.  Denies any overt ear pain.  Denies fever, chills, night sweats, difficulty breathing/swallowing, swelling of the tongue.  Patient reports being able to swallow but with pain.  Past medical history significant for diabetes mellitus, ADHD, hypertension, obesity  Home Medications Prior to Admission medications   Medication Sig Start Date End Date Taking? Authorizing Provider  clindamycin (CLEOCIN) 300 MG capsule Take 1 capsule (300 mg total) by mouth 3 (three) times daily. 08/28/22  Yes Dion Saucier A, PA  aspirin EC 81 MG tablet Take 1 tablet (81 mg total) by mouth daily. Swallow whole. 11/24/21   Early Osmond, MD  atorvastatin (LIPITOR) 40 MG tablet Take 1 tablet (40 mg total) by mouth daily. 11/24/21 02/22/22  Early Osmond, MD  lisinopril (ZESTRIL) 10 MG tablet Take 1 tablet (10 mg total) by mouth daily. 11/24/21   Early Osmond, MD      Allergies    Metformin and related and Penicillins    Review of Systems   Review of Systems  All other systems reviewed and are negative.   Physical Exam Updated Vital Signs BP (!) 169/98   Pulse 88   Temp 98.8 F (37.1 C)   Resp 18   Ht 6' 2"  (1.88 m)   Wt 127 kg   SpO2 98%   BMI 35.95 kg/m  Physical Exam Vitals and nursing note reviewed.  Constitutional:      General: He is not in acute distress.    Appearance: He is well-developed.  HENT:     Head: Normocephalic and atraumatic.     Right Ear: Tympanic membrane normal.     Left Ear: Tympanic membrane normal.     Nose: No congestion.     Mouth/Throat:      Pharynx: Uvula midline.     Comments: Tonsils 2-3+ bilaterally with minimal exudate.  Posterior pharyngeal erythema noted.  Dentition in good repair with no obvious tenderness to palpation.  No sublingual swelling noted.  No significant other swelling noted.  No obvious stridor auscultated. Eyes:     Conjunctiva/sclera: Conjunctivae normal.  Cardiovascular:     Rate and Rhythm: Normal rate and regular rhythm.     Heart sounds: No murmur heard. Pulmonary:     Effort: Pulmonary effort is normal. No respiratory distress.     Breath sounds: Normal breath sounds.  Abdominal:     Palpations: Abdomen is soft.     Tenderness: There is no abdominal tenderness.  Musculoskeletal:        General: No swelling.     Cervical back: Neck supple.  Skin:    General: Skin is warm and dry.     Capillary Refill: Capillary refill takes less than 2 seconds.  Neurological:     Mental Status: He is alert.  Psychiatric:        Mood and Affect: Mood normal.     ED Results / Procedures / Treatments   Labs (all labs ordered are listed, but only abnormal results are displayed) Labs Reviewed  GROUP A STREP  BY PCR - Abnormal; Notable for the following components:      Result Value   Group A Strep by PCR DETECTED (*)    All other components within normal limits    EKG None  Radiology No results found.  Procedures Procedures    Medications Ordered in ED Medications  clindamycin (CLEOCIN) capsule 300 mg (has no administration in time range)    ED Course/ Medical Decision Making/ A&P                           Medical Decision Making  This patient presents to the ED for concern of sore throat, this involves an extensive number of treatment options, and is a complaint that carries with it a high risk of complications and morbidity.  The differential diagnosis includes strep pharyngitis, viral pharyngitis, Ludewig's angina, Lemierre's disease, peritonsillar abscess, periapical abscess,  anaphylaxis, Ludwig angina   Co morbidities that complicate the patient evaluation  See HPI   Additional history obtained:  Additional history obtained from EMR External records from outside source obtained and reviewed including hospital records   Lab Tests:  I Ordered, and personally interpreted labs.  The pertinent results include: Positive group A strep   Imaging Studies ordered:  N/a   Cardiac Monitoring: / EKG:  The patient was maintained on a cardiac monitor.  I personally viewed and interpreted the cardiac monitored which showed an underlying rhythm of: Sinus rhythm   Consultations Obtained:  N/a   Problem List / ED Course / Critical interventions / Medication management  Strep pharyngitis I ordered medication including clindamycin because patient reported anaphylactic allergy to penicillin Reevaluation of the patient after these medicines showed that the patient improved I have reviewed the patients home medicines and have made adjustments as needed   Social Determinants of Health:  Denies tobacco, illicit drug use   Test / Admission - Considered:  Strep pharyngitis Vitals signs significant for hypertension with a blood pressure 169/98.  Recommend close follow-up with PCP regarding elevated blood pressure.. Otherwise within normal range and stable throughout visit. Laboratory studies significant for: See above Patient's symptoms likely secondary to strep pharyngitis.  No evidence of Ludwig angina, peritonsillar abscess.  Further work-up deemed necessary at this time.  Patient tolerating p.o. without difficulty.  Patient prescribed clindamycin due to anaphylactic type allergy to penicillins in the past.  ENT number provided per patient request.  Treatment plan discussed with the patient he acknowledged understanding was agreeable to said plan. Worrisome signs and symptoms were discussed with the patient, and the patient acknowledged understanding to return  to the ED if noticed. Patient was stable upon discharge.          Final Clinical Impression(s) / ED Diagnoses Final diagnoses:  Strep pharyngitis    Rx / DC Orders ED Discharge Orders          Ordered    clindamycin (CLEOCIN) 300 MG capsule  3 times daily        08/28/22 2308              Wilnette Kales, [place] 08/28/22 2315    Tretha Sciara, MD 08/30/22 1534

## 2022-08-28 NOTE — ED Triage Notes (Addendum)
Pt reports L tonsillar swelling radiating into L ear since yesterday. Pt wife reports that he is coughing up tonsil stones. Tonsillar redness observed, no swelling noted. Airway patent. Ambulatory to triage.

## 2022-08-28 NOTE — Discharge Instructions (Addendum)
Note the work-up today was overall consistent with strep throat.  We will treat this with antibiotic called clindamycin 3 times daily for the next 10 days.  Take Tylenol/Motrin as scheduled for her pain.  Please do not hesitate to return to the emergency department if the worrisome signs symptoms we discussed, parent.

## 2023-04-11 ENCOUNTER — Emergency Department (HOSPITAL_COMMUNITY): Payer: BC Managed Care – PPO

## 2023-04-11 ENCOUNTER — Other Ambulatory Visit: Payer: Self-pay

## 2023-04-11 ENCOUNTER — Encounter (HOSPITAL_COMMUNITY): Payer: Self-pay

## 2023-04-11 ENCOUNTER — Observation Stay (HOSPITAL_COMMUNITY)
Admission: EM | Admit: 2023-04-11 | Discharge: 2023-04-12 | Disposition: A | Discharge status: Home / Self Care | Payer: BC Managed Care – PPO | Attending: Internal Medicine | Admitting: Internal Medicine

## 2023-04-11 DIAGNOSIS — E119 Type 2 diabetes mellitus without complications: Secondary | ICD-10-CM

## 2023-04-11 DIAGNOSIS — I10 Essential (primary) hypertension: Secondary | ICD-10-CM | POA: Diagnosis not present

## 2023-04-11 DIAGNOSIS — I639 Cerebral infarction, unspecified: Secondary | ICD-10-CM | POA: Diagnosis present

## 2023-04-11 DIAGNOSIS — Z87891 Personal history of nicotine dependence: Secondary | ICD-10-CM | POA: Insufficient documentation

## 2023-04-11 DIAGNOSIS — G459 Transient cerebral ischemic attack, unspecified: Secondary | ICD-10-CM | POA: Diagnosis not present

## 2023-04-11 DIAGNOSIS — Z79899 Other long term (current) drug therapy: Secondary | ICD-10-CM | POA: Diagnosis not present

## 2023-04-11 DIAGNOSIS — Z6836 Body mass index (BMI) 36.0-36.9, adult: Secondary | ICD-10-CM | POA: Diagnosis not present

## 2023-04-11 DIAGNOSIS — K5100 Ulcerative (chronic) pancolitis without complications: Secondary | ICD-10-CM | POA: Diagnosis present

## 2023-04-11 DIAGNOSIS — E1169 Type 2 diabetes mellitus with other specified complication: Secondary | ICD-10-CM

## 2023-04-11 DIAGNOSIS — E1165 Type 2 diabetes mellitus with hyperglycemia: Secondary | ICD-10-CM | POA: Insufficient documentation

## 2023-04-11 DIAGNOSIS — R2990 Unspecified symptoms and signs involving the nervous system: Secondary | ICD-10-CM | POA: Insufficient documentation

## 2023-04-11 DIAGNOSIS — E66813 Obesity, class 3: Secondary | ICD-10-CM | POA: Diagnosis present

## 2023-04-11 DIAGNOSIS — E785 Hyperlipidemia, unspecified: Secondary | ICD-10-CM | POA: Diagnosis present

## 2023-04-11 DIAGNOSIS — Z7982 Long term (current) use of aspirin: Secondary | ICD-10-CM | POA: Insufficient documentation

## 2023-04-11 DIAGNOSIS — R200 Anesthesia of skin: Principal | ICD-10-CM | POA: Insufficient documentation

## 2023-04-11 DIAGNOSIS — E669 Obesity, unspecified: Secondary | ICD-10-CM | POA: Insufficient documentation

## 2023-04-11 DIAGNOSIS — K529 Noninfective gastroenteritis and colitis, unspecified: Secondary | ICD-10-CM | POA: Diagnosis not present

## 2023-04-11 LAB — CREATININE, SERUM
Creatinine, Ser: 0.9 mg/dL (ref 0.61–1.24)
GFR, Estimated: >60 mL/min (ref >60)

## 2023-04-11 LAB — CBC
HCT: 46.4 % (ref 39.0–52.0)
HCT: 43.7 % (ref 39.0–52.0)
Hemoglobin: 16.4 g/dL (ref 13.0–17.0)
Hemoglobin: 15.8 g/dL (ref 13.0–17.0)
MCH: 29.2 pg (ref 26.0–34.0)
MCH: 30.3 pg (ref 26.0–34.0)
MCHC: 35.3 g/dL (ref 30.0–36.0)
MCHC: 36.2 g/dL — ABNORMAL HIGH (ref 30.0–36.0)
MCV: 82.6 fL (ref 80.0–100.0)
MCV: 83.7 fL (ref 80.0–100.0)
Platelets: 246 10*3/uL (ref 150–400)
Platelets: 238 10*3/uL (ref 150–400)
RBC: 5.62 MIL/uL (ref 4.22–5.81)
RBC: 5.22 MIL/uL (ref 4.22–5.81)
RDW: 13.2 % (ref 11.5–15.5)
RDW: 13.3 % (ref 11.5–15.5)
WBC: 10.1 10*3/uL (ref 4.0–10.5)
WBC: 9.7 10*3/uL (ref 4.0–10.5)
nRBC: 0 % (ref 0.0–0.2)
nRBC: 0 % (ref 0.0–0.2)

## 2023-04-11 LAB — I-STAT CHEM 8, ED
BUN: 13 mg/dL (ref 6–20)
Calcium, Ion: 1.17 mmol/L (ref 1.15–1.40)
Chloride: 103 mmol/L (ref 98–111)
Creatinine, Ser: 0.5 mg/dL — ABNORMAL LOW (ref 0.61–1.24)
Glucose, Bld: 335 mg/dL — ABNORMAL HIGH (ref 70–99)
HCT: 47 % (ref 39.0–52.0)
Hemoglobin: 16 g/dL (ref 13.0–17.0)
Potassium: 4 mmol/L (ref 3.5–5.1)
Sodium: 136 mmol/L (ref 135–145)
TCO2: 25 mmol/L (ref 22–32)

## 2023-04-11 LAB — DIFFERENTIAL
Abs Immature Granulocytes: 0.06 10*3/uL (ref 0.00–0.07)
Basophils Absolute: 0.1 10*3/uL (ref 0.0–0.1)
Basophils Relative: 1 %
Eosinophils Absolute: 0.4 10*3/uL (ref 0.0–0.5)
Eosinophils Relative: 4 %
Immature Granulocytes: 1 %
Lymphocytes Relative: 38 %
Lymphs Abs: 3.8 10*3/uL (ref 0.7–4.0)
Monocytes Absolute: 0.6 10*3/uL (ref 0.1–1.0)
Monocytes Relative: 6 %
Neutro Abs: 5.2 10*3/uL (ref 1.7–7.7)
Neutrophils Relative %: 50 %

## 2023-04-11 LAB — GLUCOSE, CAPILLARY: Glucose-Capillary: 356 mg/dL — ABNORMAL HIGH (ref 70–99)

## 2023-04-11 LAB — COMPREHENSIVE METABOLIC PANEL
ALT: 32 U/L (ref 0–44)
AST: 24 U/L (ref 15–41)
Albumin: 3.7 g/dL (ref 3.5–5.0)
Alkaline Phosphatase: 105 U/L (ref 38–126)
Anion gap: 11 (ref 5–15)
BUN: 11 mg/dL (ref 6–20)
CO2: 22 mmol/L (ref 22–32)
Calcium: 9.2 mg/dL (ref 8.9–10.3)
Chloride: 101 mmol/L (ref 98–111)
Creatinine, Ser: 0.69 mg/dL (ref 0.61–1.24)
GFR, Estimated: >60 mL/min (ref >60)
Glucose, Bld: 322 mg/dL — ABNORMAL HIGH (ref 70–99)
Potassium: 4 mmol/L (ref 3.5–5.1)
Sodium: 134 mmol/L — ABNORMAL LOW (ref 135–145)
Total Bilirubin: 0.7 mg/dL (ref 0.3–1.2)
Total Protein: 7 g/dL (ref 6.5–8.1)

## 2023-04-11 LAB — PROTIME-INR
INR: 0.9 (ref 0.8–1.2)
Prothrombin Time: 12.2 seconds (ref 11.4–15.2)

## 2023-04-11 LAB — ETHANOL: Alcohol, Ethyl (B): <10 mg/dL (ref <10)

## 2023-04-11 LAB — TSH: TSH: 2.298 u[IU]/mL (ref 0.350–4.500)

## 2023-04-11 LAB — HEMOGLOBIN A1C
Hgb A1c MFr Bld: 10.5 % — ABNORMAL HIGH (ref 4.8–5.6)
Mean Plasma Glucose: 254.65 mg/dL

## 2023-04-11 LAB — CBG MONITORING, ED: Glucose-Capillary: 279 mg/dL — ABNORMAL HIGH (ref 70–99)

## 2023-04-11 LAB — APTT: aPTT: 21 seconds — ABNORMAL LOW (ref 24–36)

## 2023-04-11 LAB — HIV ANTIBODY (ROUTINE TESTING W REFLEX): HIV Screen 4th Generation wRfx: NONREACTIVE

## 2023-04-11 MED ORDER — ASPIRIN 81 MG PO TBEC
81.0000 mg | DELAYED_RELEASE_TABLET | Freq: Every day | ORAL | Status: DC
  Administered 2023-04-12: 81 mg via ORAL
  Filled 2023-04-11: qty 1

## 2023-04-11 MED ORDER — STROKE: EARLY STAGES OF RECOVERY BOOK
Freq: Once | Status: AC
  Filled 2023-04-11: qty 1

## 2023-04-11 MED ORDER — GABAPENTIN 300 MG PO CAPS
300.0000 mg | ORAL_CAPSULE | Freq: Every day | ORAL | Status: DC
  Administered 2023-04-11: 300 mg via ORAL
  Filled 2023-04-11: qty 1

## 2023-04-11 MED ORDER — CLOPIDOGREL BISULFATE 300 MG PO TABS
300.0000 mg | ORAL_TABLET | Freq: Once | ORAL | Status: AC
  Administered 2023-04-11: 300 mg via ORAL
  Filled 2023-04-11: qty 1

## 2023-04-11 MED ORDER — ONDANSETRON HCL 4 MG PO TABS: 4.0000 mg | ORAL_TABLET | Freq: Four times a day (QID) | ORAL | Status: DC | PRN

## 2023-04-11 MED ORDER — ACETAMINOPHEN 650 MG RE SUPP: 650.0000 mg | Freq: Four times a day (QID) | RECTAL | Status: DC | PRN

## 2023-04-11 MED ORDER — PANTOPRAZOLE SODIUM 40 MG PO TBEC
40.0000 mg | DELAYED_RELEASE_TABLET | Freq: Every day | ORAL | Status: DC
  Administered 2023-04-11 – 2023-04-12 (×2): 40 mg via ORAL
  Filled 2023-04-11 (×2): qty 1

## 2023-04-11 MED ORDER — INSULIN ASPART 100 UNIT/ML IJ SOLN
0.0000 [IU] | Freq: Three times a day (TID) | INTRAMUSCULAR | Status: DC
  Administered 2023-04-12 (×2): 8 [IU] via SUBCUTANEOUS
  Administered 2023-04-12: 3 [IU] via SUBCUTANEOUS

## 2023-04-11 MED ORDER — ROSUVASTATIN CALCIUM 5 MG PO TABS
10.0000 mg | ORAL_TABLET | Freq: Every day | ORAL | Status: DC
  Administered 2023-04-11: 10 mg via ORAL
  Filled 2023-04-11: qty 2

## 2023-04-11 MED ORDER — IOHEXOL 350 MG/ML SOLN
75.0000 mL | Freq: Once | INTRAVENOUS | Status: AC | PRN
  Administered 2023-04-11: 75 mL via INTRAVENOUS

## 2023-04-11 MED ORDER — LORAZEPAM 2 MG/ML IJ SOLN
2.0000 mg | Freq: Once | INTRAMUSCULAR | Status: AC | PRN
  Administered 2023-04-11: 2 mg via INTRAVENOUS
  Filled 2023-04-11: qty 1

## 2023-04-11 MED ORDER — ONDANSETRON HCL 4 MG/2ML IJ SOLN: 4.0000 mg | Freq: Four times a day (QID) | INTRAMUSCULAR | Status: DC | PRN

## 2023-04-11 MED ORDER — ACETAMINOPHEN 325 MG PO TABS: 650.0000 mg | ORAL_TABLET | Freq: Four times a day (QID) | ORAL | Status: DC | PRN

## 2023-04-11 MED ORDER — ENOXAPARIN SODIUM 40 MG/0.4ML IJ SOSY
40.0000 mg | PREFILLED_SYRINGE | INTRAMUSCULAR | Status: DC
  Administered 2023-04-11 – 2023-04-12 (×2): 40 mg via SUBCUTANEOUS
  Filled 2023-04-11 (×2): qty 0.4

## 2023-04-11 MED ORDER — SODIUM CHLORIDE 0.9% FLUSH
3.0000 mL | Freq: Once | INTRAVENOUS | Status: AC
  Administered 2023-04-11: 3 mL via INTRAVENOUS

## 2023-04-11 MED ORDER — INSULIN ASPART 100 UNIT/ML IJ SOLN
0.0000 [IU] | Freq: Every day | INTRAMUSCULAR | Status: DC
  Administered 2023-04-11: 5 [IU] via SUBCUTANEOUS

## 2023-04-11 MED ORDER — GABAPENTIN 100 MG PO CAPS: 100.0000 mg | ORAL_CAPSULE | Freq: Every day | ORAL | Status: DC

## 2023-04-11 MED ORDER — POLYETHYLENE GLYCOL 3350 17 G PO PACK: 17.0000 g | PACK | Freq: Every day | ORAL | Status: DC | PRN

## 2023-04-11 NOTE — Plan of Care (Signed)
Problem: Education: Goal: Knowledge of disease or condition will improve Outcome: Progressing Goal: Knowledge of secondary prevention will improve (MUST DOCUMENT ALL) Outcome: Progressing Goal: Knowledge of patient specific risk factors will improve Loraine Leriche N/A or DELETE if not current risk factor) Outcome: Progressing   Problem: Ischemic Stroke/TIA Tissue Perfusion: Goal: Complications of ischemic stroke/TIA will be minimized Outcome: Progressing   Problem: Coping: Goal: Will verbalize positive feelings about self Outcome: Progressing Goal: Will identify appropriate support needs Outcome: Progressing   Problem: Health Behavior/Discharge Planning: Goal: Ability to manage health-related needs will improve Outcome: Progressing Goal: Goals will be collaboratively established with patient/family Outcome: Progressing   Problem: Self-Care: Goal: Ability to participate in self-care as condition permits will improve Outcome: Progressing Goal: Verbalization of feelings and concerns over difficulty with self-care will improve Outcome: Progressing Goal: Ability to communicate needs accurately will improve Outcome: Progressing   Problem: Nutrition: Goal: Risk of aspiration will decrease Outcome: Progressing Goal: Dietary intake will improve Outcome: Progressing   Problem: Education: Goal: Knowledge of General Education information will improve Description: Including pain rating scale, medication(s)/side effects and non-pharmacologic comfort measures Outcome: Progressing   Problem: Health Behavior/Discharge Planning: Goal: Ability to manage health-related needs will improve Outcome: Progressing   Problem: Clinical Measurements: Goal: Ability to maintain clinical measurements within normal limits will improve Outcome: Progressing Goal: Will remain free from infection Outcome: Progressing Goal: Diagnostic test results will improve Outcome: Progressing Goal: Respiratory  complications will improve Outcome: Progressing Goal: Cardiovascular complication will be avoided Outcome: Progressing   Problem: Activity: Goal: Risk for activity intolerance will decrease Outcome: Progressing   Problem: Nutrition: Goal: Adequate nutrition will be maintained Outcome: Progressing   Problem: Coping: Goal: Level of anxiety will decrease Outcome: Progressing   Problem: Elimination: Goal: Will not experience complications related to bowel motility Outcome: Progressing Goal: Will not experience complications related to urinary retention Outcome: Progressing   Problem: Pain Managment: Goal: General experience of comfort will improve Outcome: Progressing   Problem: Safety: Goal: Ability to remain free from injury will improve Outcome: Progressing   Problem: Skin Integrity: Goal: Risk for impaired skin integrity will decrease Outcome: Progressing   Problem: Education: Goal: Knowledge of disease or condition will improve Outcome: Progressing Goal: Knowledge of secondary prevention will improve (MUST DOCUMENT ALL) Outcome: Progressing Goal: Knowledge of patient specific risk factors will improve Loraine Leriche N/A or DELETE if not current risk factor) Outcome: Progressing   Problem: Ischemic Stroke/TIA Tissue Perfusion: Goal: Complications of ischemic stroke/TIA will be minimized Outcome: Progressing   Problem: Coping: Goal: Will verbalize positive feelings about self Outcome: Progressing Goal: Will identify appropriate support needs Outcome: Progressing   Problem: Health Behavior/Discharge Planning: Goal: Ability to manage health-related needs will improve Outcome: Progressing Goal: Goals will be collaboratively established with patient/family Outcome: Progressing   Problem: Self-Care: Goal: Ability to participate in self-care as condition permits will improve Outcome: Progressing Goal: Verbalization of feelings and concerns over difficulty with self-care  will improve Outcome: Progressing Goal: Ability to communicate needs accurately will improve Outcome: Progressing   Problem: Nutrition: Goal: Risk of aspiration will decrease Outcome: Progressing Goal: Dietary intake will improve Outcome: Progressing   Problem: Education: Goal: Knowledge of General Education information will improve Description: Including pain rating scale, medication(s)/side effects and non-pharmacologic comfort measures Outcome: Progressing   Problem: Health Behavior/Discharge Planning: Goal: Ability to manage health-related needs will improve Outcome: Progressing   Problem: Clinical Measurements: Goal: Ability to maintain clinical measurements within normal limits will improve Outcome: Progressing Goal: Will remain free  from infection Outcome: Progressing Goal: Diagnostic test results will improve Outcome: Progressing Goal: Respiratory complications will improve Outcome: Progressing Goal: Cardiovascular complication will be avoided Outcome: Progressing   Problem: Activity: Goal: Risk for activity intolerance will decrease Outcome: Progressing   Problem: Nutrition: Goal: Adequate nutrition will be maintained Outcome: Progressing   Problem: Coping: Goal: Level of anxiety will decrease Outcome: Progressing   Problem: Elimination: Goal: Will not experience complications related to bowel motility Outcome: Progressing Goal: Will not experience complications related to urinary retention Outcome: Progressing   Problem: Pain Managment: Goal: General experience of comfort will improve Outcome: Progressing   Problem: Safety: Goal: Ability to remain free from injury will improve Outcome: Progressing   Problem: Skin Integrity: Goal: Risk for impaired skin integrity will decrease Outcome: Progressing

## 2023-04-11 NOTE — ED Provider Triage Note (Signed)
Emergency Medicine Provider Triage Evaluation Note  Patrick Munoz , a 37 y.o. male  was evaluated in triage.  Pt complains of left-sided numbness.  As he had an initial episode at 7 AM which resolved shortly after.  This particular episode started 90 minutes ago.  On exam he does have left-sided sensation deficit including the face, left arm, and left leg.  On exam strength seems to be symmetrical.  Pupils are dilated but equal and reactive.  CBG of 279.  Unable to recall today's date or year.  Otherwise alert and oriented.  Review of Systems  Positive: As above Negative: As above  Physical Exam  There were no vitals taken for this visit. Gen:   Awake, no distress   Resp:  Normal effort  MSK:   Moves extremities without difficulty  Other:  Strength.  Sensation deficit on the left side including leg, left arm, and the face.  Medical Decision Making  Medically screening exam initiated at 2:38 PM.  Appropriate orders placed.  Patrick Munoz was informed that the remainder of the evaluation will be completed by another provider, this initial triage assessment does not replace that evaluation, and the importance of remaining in the ED until their evaluation is complete.  Code stroke activated.  Attending notified.   Marita [place], PA-C 04/11/23 1441

## 2023-04-11 NOTE — ED Triage Notes (Signed)
Code stroke. LKW at 0700 this AM. Came in for left arm numbness and weakness. Alert and oriented x 4.

## 2023-04-11 NOTE — ED Provider Notes (Signed)
[facility] AT [facility] Provider Note   CSN: [id number] Arrival date & time: 04/11/23  1426     History  Chief Complaint  Patient presents with   Code Stroke    Patrick Munoz is a 37 y.o. male.  HPI Patient presents for strokelike symptoms see.  Medical history include ADHD, DM, HTN.  He had a transient episode of left-sided numbness that occurred at 7 AM this morning.  Symptoms resolved.  He had recurrence of left hemibody numbness 90 minutes prior to arrival.  Symptoms have been persistent since that time.    Home Medications Prior to Admission medications   Medication Sig Start Date End Date Taking? Authorizing Provider  aspirin EC 81 MG tablet Take 1 tablet (81 mg total) by mouth daily. Swallow whole. 11/24/21   Orbie Pyo, MD  atorvastatin (LIPITOR) 40 MG tablet Take 1 tablet (40 mg total) by mouth daily. 11/24/21 02/22/22  Orbie Pyo, MD  clindamycin (CLEOCIN) 300 MG capsule Take 1 capsule (300 mg total) by mouth 3 (three) times daily. 08/28/22   Sherian Maroon A, PA  lisinopril (ZESTRIL) 10 MG tablet Take 1 tablet (10 mg total) by mouth daily. 11/24/21   Orbie Pyo, MD      Allergies    Metformin and related and Penicillins    Review of Systems   Review of Systems  Neurological:  Positive for numbness.  All other systems reviewed and are negative.   Physical Exam Updated Vital Signs BP (!) 148/92   Pulse 71   Temp 98.1 F (36.7 C) (Oral)   Resp 18   Wt 130.6 kg   SpO2 97%   BMI 36.97 kg/m  Physical Exam Vitals and nursing note reviewed.  Constitutional:      General: He is not in acute distress.    Appearance: Normal appearance. He is well-developed. He is not ill-appearing, toxic-appearing or diaphoretic.  HENT:     Head: Normocephalic and atraumatic.     Right Ear: External ear normal.     Left Ear: External ear normal.     Nose: Nose normal.     Mouth/Throat:     Mouth: Mucous membranes are moist.   Eyes:     Extraocular Movements: Extraocular movements intact.     Conjunctiva/sclera: Conjunctivae normal.  Cardiovascular:     Rate and Rhythm: Normal rate and regular rhythm.  Pulmonary:     Effort: Pulmonary effort is normal. No respiratory distress.  Abdominal:     General: There is no distension.     Palpations: Abdomen is soft.  Musculoskeletal:        General: No swelling. Normal range of motion.     Cervical back: Normal range of motion and neck supple.     Right lower leg: No edema.     Left lower leg: No edema.  Skin:    General: Skin is warm and dry.     Coloration: Skin is not jaundiced or pale.  Neurological:     Mental Status: He is alert and oriented to person, place, and time.     Comments: Slight facial asymmetry, mild slurred and slowed speech, slight weakness to left hemibody.  Psychiatric:        Mood and Affect: Mood normal.        Behavior: Behavior normal.     ED Results / Procedures / Treatments   Labs (all labs ordered are listed, but only abnormal results are  displayed) Labs Reviewed  APTT - Abnormal; Notable for the following components:      Result Value   aPTT 21 (*)    All other components within normal limits  COMPREHENSIVE METABOLIC PANEL - Abnormal; Notable for the following components:   Sodium 134 (*)    Glucose, Bld 322 (*)    All other components within normal limits  CBG MONITORING, ED - Abnormal; Notable for the following components:   Glucose-Capillary 279 (*)    All other components within normal limits  I-STAT CHEM 8, ED - Abnormal; Notable for the following components:   Creatinine, Ser 0.50 (*)    Glucose, Bld 335 (*)    All other components within normal limits  PROTIME-INR  CBC  DIFFERENTIAL  ETHANOL  HEMOGLOBIN A1C  HIV ANTIBODY (ROUTINE TESTING W REFLEX)  CBC  CREATININE, SERUM  TSH  LIPID PANEL  LIPID PANEL  CBG MONITORING, ED    EKG None  Radiology CT ANGIO HEAD NECK W WO CM (CODE STROKE)  Result  Date: 04/11/2023 CLINICAL DATA:  Neuro deficit, acute, stroke suspected EXAM: CT ANGIOGRAPHY HEAD AND NECK WITH AND WITHOUT CONTRAST TECHNIQUE: Multidetector CT imaging of the head and neck was performed using the standard protocol during bolus administration of intravenous contrast. Multiplanar CT image reconstructions and MIPs were obtained to evaluate the vascular anatomy. Carotid stenosis measurements (when applicable) are obtained utilizing NASCET criteria, using the distal internal carotid diameter as the denominator. RADIATION DOSE REDUCTION: This exam was performed according to the departmental dose-optimization program which includes automated exposure control, adjustment of the mA and/or kV according to patient size and/or use of iterative reconstruction technique. CONTRAST:  75mL OMNIPAQUE IOHEXOL 350 MG/ML SOLN COMPARISON:  Same day CT head. FINDINGS: CTA NECK FINDINGS Aortic arch: Great vessel origins are patent without significant stenosis. Right carotid system: No evidence of dissection, stenosis (50% or greater), or occlusion. Left carotid system: No evidence of dissection, stenosis (50% or greater), or occlusion. Vertebral arteries: Codominant. No evidence of dissection, stenosis (50% or greater), or occlusion. Skeleton: No acute abnormality on limited assessment. Other neck: No acute abnormality on limited assessment. Upper chest: Visualized lung apices are clear. Review of the MIP images confirms the above findings CTA HEAD FINDINGS Anterior circulation: Bilateral intracranial ICAs, MCAs, and ACAs are patent without proximal hemodynamically significant stenosis. No aneurysm identified. Posterior circulation:  Bilateral intradural vertebral arteries, Venous sinuses: As permitted by contrast timing, patent. Review of the MIP images confirms the above findings IMPRESSION: No emergent large vessel occlusion or proximal hemodynamically significant stenosis. Electronically Signed   By: Feliberto Harts  M.D.   On: 04/11/2023 15:13   CT HEAD CODE STROKE WO CONTRAST  Result Date: 04/11/2023 CLINICAL DATA:  Code stroke.  Neuro deficit, acute, stroke suspected EXAM: CT HEAD WITHOUT CONTRAST TECHNIQUE: Contiguous axial images were obtained from the base of the skull through the vertex without intravenous contrast. RADIATION DOSE REDUCTION: This exam was performed according to the departmental dose-optimization program which includes automated exposure control, adjustment of the mA and/or kV according to patient size and/or use of iterative reconstruction technique. COMPARISON:  None Available. FINDINGS: Brain: No evidence of acute infarction, hemorrhage, hydrocephalus, extra-axial collection or mass lesion/mass effect. Vascular: No hyperdense vessel. Skull: No acute fracture. Sinuses/Orbits: Clear sinuses.  No acute orbital findings. Total score (0-10 with 10 being normal): 10. IMPRESSION: 1. No evidence of acute intracranial abnormality. 2. ASPECTS is 10. Code stroke imaging results were communicated on 04/11/2023 at 3:06 pm  to provider Bhagat via secure text paging. Electronically Signed   By: Feliberto Harts M.D.   On: 04/11/2023 15:07    Procedures Procedures    Medications Ordered in ED Medications  sodium chloride flush (NS) 0.9 % injection 3 mL (has no administration in time range)   stroke: early stages of recovery book (has no administration in time range)   stroke: early stages of recovery book (has no administration in time range)  insulin aspart (novoLOG) injection 0-15 Units (has no administration in time range)  insulin aspart (novoLOG) injection 0-5 Units (has no administration in time range)  enoxaparin (LOVENOX) injection 40 mg (has no administration in time range)  acetaminophen (TYLENOL) tablet 650 mg (has no administration in time range)    Or  acetaminophen (TYLENOL) suppository 650 mg (has no administration in time range)  polyethylene glycol (MIRALAX / GLYCOLAX) packet 17 g  (has no administration in time range)  ondansetron (ZOFRAN) tablet 4 mg (has no administration in time range)    Or  ondansetron (ZOFRAN) injection 4 mg (has no administration in time range)  iohexol (OMNIPAQUE) 350 MG/ML injection 75 mL (75 mLs Intravenous Contrast Given 04/11/23 1504)  clopidogrel (PLAVIX) tablet 300 mg (300 mg Oral Given 04/11/23 1525)  LORazepam (ATIVAN) injection 2 mg (2 mg Intravenous Given 04/11/23 1633)    ED Course/ Medical Decision Making/ A&P                             Medical Decision Making Amount and/or Complexity of Data Reviewed Labs: ordered.  Risk OTC drugs. Prescription drug management. Decision regarding hospitalization.   This patient presents to the ED for concern of strokelike symptoms, this involves an extensive number of treatment options, and is a complaint that carries with it a high risk of complications and morbidity.  The differential diagnosis includes CVA, TIA, seizure, metabolic derangements, complex migraine   Co morbidities that complicate the patient evaluation  ADHD, DM, HTN   Additional history obtained:  Additional history obtained from patient's wife External records from outside source obtained and reviewed including EMR   Lab Tests:  I Ordered, and personally interpreted labs.  The pertinent results include: Hyperglycemia without evidence of DKA, normal hemoglobin, no leukocytosis   Imaging Studies ordered:  I ordered imaging studies including CT head, CTA head and neck I independently visualized and interpreted imaging which showed no acute findings I agree with the radiologist interpretation   Cardiac Monitoring: / EKG:  The patient was maintained on a cardiac monitor.  I personally viewed and interpreted the cardiac monitored which showed an underlying rhythm of: Sinus rhythm   Consultations Obtained:  I requested consultation with the neurology,  and discussed lab and imaging findings as well as  pertinent plan - they recommend: Admission for stroke workup   Problem List / ED Course / Critical interventions / Medication management  Patient presents for acute onset of left-sided numbness approximately 90 minutes prior to arrival.  On initial triage exam, patient had diminished sensation to left hemibody.  Code stroke was initiated.  Patient was taken to CT scanner.  Initial CT imaging did not show acute findings.  On exam, patient does appear to have some mild left hemibody weakness and some slight facial asymmetry.  His wife, who accompanies him at bedside, states that his speech is not normal.  She feels that it is slowed and slurred more than his baseline.  Neurology evaluated  and recommends MRI.  Patient declined aspirin but was given Plavix.  Patient was admitted to medicine for further stroke workup.   Social Determinants of Health:  Has PCP         Final Clinical Impression(s) / ED Diagnoses Final diagnoses:  Stroke-like symptoms    Rx / DC Orders ED Discharge Orders     None         Gloris Manchester, MD 04/11/23 1738

## 2023-04-11 NOTE — ED Notes (Signed)
Pt presents w/ headache, L face/arm/leg numbness, L arm weakness, and difficulty finding words.  Pt reports have a mild episode around 0700 which he sts resolved and then, symptoms returned around 1300.    Pt's wife reports is normally A &Ox4.

## 2023-04-11 NOTE — ED Notes (Signed)
Patient transported to MRI 

## 2023-04-11 NOTE — Consult Note (Signed)
Neurology Consultation  Reason for Consult: Code Stroke Referring Physician: Durwin Nora  [id number] sided weakness and numbness  History is obtained from: patient, spouse/SO, and past medical records  HPI: Patrick Munoz is a 37 y.o. male with a past medical history of tobacco use, ADHD, back pain, DM, HTN, possible ulcerative colitis presenting with left sided weakness and numbness. He is also reporting a headache. This morning he noticed at 0700 that he was numb on the left side. At 1300 he told his wife that he felt numb and weak on the left side and she encouraged him to present to the ED. He is anxious. He does have claustrophobia, will order ativan for MRI. He is not currently taking ASA 81mg  due to his possible ulcerative colitis. He follows with [facility], current medications listed below.   Currently taking  Rosuvastatin 5mg  3 times per week Gabapentin 100mg  Lisinopril hydrochlorothiazide 10-12.5 Novolin 10u BID protonix 40  LKW: 0700 TNK given?: No outside of the window Premorbid modified Rankin scale (mRS):  0-Completely asymptomatic and back to baseline post-stroke    ROS: Full ROS was performed and is negative except as noted in the HPI.  Past Medical History:  Diagnosis Date   ADHD (attention deficit hyperactivity disorder)    Back pain    Complication of anesthesia    Patient states he has been violent in the past after waking up from anesthsia   Diabetes mellitus without complication (HCC)    Hypertension     Family History  Problem Relation Age of Onset   Colon cancer Maternal Grandfather    Stomach cancer Maternal Grandfather     Social History:   reports that he has quit smoking. His smoking use included cigars. His smokeless tobacco use includes snuff and chew. He reports current alcohol use. He reports that he does not use drugs.  Medications  Current Facility-Administered Medications:    [START ON 04/12/2023]  stroke: early stages of recovery  book, , Does not apply, Once, Elmer Picker, NP   LORazepam (ATIVAN) injection 2 mg, 2 mg, Intravenous, Once PRN, Elmer Picker, NP   sodium chloride flush (NS) 0.9 % injection 3 mL, 3 mL, Intravenous, Once, Marita Kansas, PA-C  Current Outpatient Medications:    aspirin EC 81 MG tablet, Take 1 tablet (81 mg total) by mouth daily. Swallow whole., Disp: , Rfl:    atorvastatin (LIPITOR) 40 MG tablet, Take 1 tablet (40 mg total) by mouth daily., Disp: 90 tablet, Rfl: 3   clindamycin (CLEOCIN) 300 MG capsule, Take 1 capsule (300 mg total) by mouth 3 (three) times daily., Disp: 30 capsule, Rfl: 0   lisinopril (ZESTRIL) 10 MG tablet, Take 1 tablet (10 mg total) by mouth daily., Disp: 90 tablet, Rfl: 3  Exam: Current vital signs: BP (!) 154/100 (BP Location: Right Arm)   Pulse 81   Temp 98.1 F (36.7 C) (Oral)   Resp 17   Wt 130.6 kg   SpO2 96%   BMI 36.97 kg/m  Vital signs in last 24 hours: Temp:  [97.6 F (36.4 C)-98.1 F (36.7 C)] 98.1 F (36.7 C) (06/19 1519) Pulse Rate:  [81] 81 (06/19 1439) Resp:  [17] 17 (06/19 1439) BP: (154)/(100) 154/100 (06/19 1439) SpO2:  [96 %] 96 % (06/19 1439) Weight:  [130.6 kg] 130.6 kg (06/19 1439)  GENERAL: Awake, alert in NAD HEENT: - Normocephalic and atraumatic, dry mm, no LN++, no Thyromegally LUNGS - Clear to auscultation bilaterally with no wheezes CV - S1S2  RRR, no m/r/g, equal pulses bilaterally. ABDOMEN - Soft, nontender, nondistended with normoactive BS Ext: warm, well perfused, intact peripheral pulses, no edema  NEURO:  Mental Status: AA&Ox3  Language: speech is mildly dysarthric  Naming, repetition, fluency, and comprehension intact. Cranial Nerves: PERRL 3 mm/brisk. EOMI, visual fields full, slight left facial asymmetry, facial sensation intact, hearing intact, tongue/uvula/soft palate midline, normal sternocleidomastoid and trapezius muscle strength. No evidence of tongue atrophy or fasciculations Motor:  RUE LUE 4/5 RLE LLE  4/5 Tone: is normal and bulk is normal Sensation- Diminished sensation to the left upper and lower extremity  Coordination: FTN intact bilaterally, no ataxia in BLE. Gait- deferred  NIHSS 1a Level of Conscious.: 0 1b LOC Questions: 0 1c LOC Commands: 0 2 Best Gaze: 0 3 Visual: 0 4 Facial Palsy: 1 5a Motor Arm - left: 1 5b Motor Arm - Right: 0 6a Motor Leg - Left: 1 6b Motor Leg - Right: 0 7 Limb Ataxia: 0 8 Sensory: 1 9 Best Language: 0 10 Dysarthria: 1 11 Extinct. and Inatten.: 0 TOTAL: 5   Labs I have reviewed labs in epic and the results pertinent to this consultation are:  CBC    Component Value Date/Time   WBC 10.1 04/11/2023 1443   RBC 5.62 04/11/2023 1443   HGB 16.0 04/11/2023 1453   HCT 47.0 04/11/2023 1453   PLT 246 04/11/2023 1443   MCV 82.6 04/11/2023 1443   MCH 29.2 04/11/2023 1443   MCHC 35.3 04/11/2023 1443   RDW 13.2 04/11/2023 1443   LYMPHSABS 3.8 04/11/2023 1443   MONOABS 0.6 04/11/2023 1443   EOSABS 0.4 04/11/2023 1443   BASOSABS 0.1 04/11/2023 1443    CMP     Component Value Date/Time   NA 136 04/11/2023 1453   K 4.0 04/11/2023 1453   CL 103 04/11/2023 1453   CO2 22 04/11/2023 1443   GLUCOSE 335 (H) 04/11/2023 1453   BUN 13 04/11/2023 1453   CREATININE 0.50 (L) 04/11/2023 1453   CALCIUM 9.2 04/11/2023 1443   PROT 7.0 04/11/2023 1443   ALBUMIN 3.7 04/11/2023 1443   AST 24 04/11/2023 1443   ALT 32 04/11/2023 1443   ALKPHOS 105 04/11/2023 1443   BILITOT 0.7 04/11/2023 1443   GFRNONAA >60 04/11/2023 1443   GFRAA >60 01/03/2020 0520    Lipid Panel  No results found for: "CHOL", "TRIG", "HDL", "CHOLHDL", "VLDL", "LDLCALC", "LDLDIRECT"   Imaging I have reviewed the images obtained:  CT-head 1. No evidence of acute intracranial abnormality. 2. ASPECTS is 10.  CT Angio Head and Neck No emergent large vessel occlusion or proximal hemodynamically significant stenosis.  12/16/2022 Pathology report  There are increased eosinophils  in the epithelium and in the lamina  propria. There are no features of chronicity. These findings are  non-specific and the differential includes eosinophilic colitis,  collagen vascular disease, drug reactions, parasitosis, and idiopathic  eosinophilic syndrome. Clinical correlation is required. Dr. Kenard Gower  has reviewed the case.    MRI examination of the brain Pending   Assessment:  37 y.o. male with a past medical history of tobacco use, ADHD, back pain, DM, HTN presenting with left sided weakness and numbness w/ concern for stuttering lacunar stroke. He is also reporting a headache. This morning he noticed at 0700 that he was numb on the left side. At 1300 he told his wife that he felt numb and weak. He is not currently taking ASA 81mg  due to his ulcerative colitis, but he is taking  his protonix. He is not having any GI bleeding. He is willing to take a 300mg  plavix load.   Impression: Acute ischemic stroke   Recommendations: - HgbA1c, fasting lipid panel - The following imaging if indicated  - MRI of the brain without contrast - Echocardiogram - Prophylactic therapy-Antiplatelets   Plavix 300mg  load given, will hold further antiplatelet agents pending confirmation of GI status ASA 81mg  and Plavix 75mg  daily  - Risk factor modification - Telemetry monitoring - PT consult, OT consult, Speech consult - Consider GI consult to clarify possible ulcerative colitis diagnosis vs. record request from PCP - Stroke team to follow  Patient seen and examined by NP/APP with MD. MD to update note as needed.   Elmer Picker, DNP, FNP-BC [facility] Pager: [phone]  Attending Neurologist's note:  I personally saw this patient, gathering history, performing a full neurologic examination, reviewing relevant labs, personally reviewing relevant imaging including Head CT and CTA, and formulated the assessment and plan, adding the note above for completeness and clarity to  accurately reflect my thoughts   Brooke Dare MD-PhD [facility] [phone] Available 7 AM to 7 PM, outside these hours please contact Neurologist on call listed on AMION   CRITICAL CARE Performed by: Gordy Councilman   Total critical care time: 30 minutes  Critical care time was exclusive of separately billable procedures and treating other patients.  Critical care was necessary to treat or prevent imminent or life-threatening deterioration.  Critical care was time spent personally by me on the following activities: development of treatment plan with patient and/or surrogate as well as nursing, discussions with consultants, evaluation of patient's response to treatment, examination of patient, obtaining history from patient or surrogate, ordering and performing treatments and interventions, ordering and review of laboratory studies, ordering and review of radiographic studies, pulse oximetry and re-evaluation of patient's condition.

## 2023-04-11 NOTE — Progress Notes (Signed)
Attempted echo, but patient is in MRI at this time.

## 2023-04-11 NOTE — H&P (Signed)
History and Physical    Patient: Patrick Munoz ZOX:[id number] DOB: [birth year] DOA: 04/11/2023 DOS: the patient was seen and examined on 04/11/2023 PCP: Collene Mares, PA  Patient coming from: Home  Chief Complaint:  Chief Complaint  Patient presents with   Code Stroke   HPI: Patrick Munoz is a 37 y.o. male with medical history significant of hypertension, ADHD and diabetes mellitus came to ED after having 2 episodes of left-sided numbness and weakness.  Patient had 1 episode earlier in the morning, there was some associated headache at that time and symptoms later resolved. Symptoms restarted around 1 PM which prompted him to come to ED.  Patient was having some mild intermittent headache, no associated change in vision, nausea or vomiting.  No prior history of migraine headaches.  No photophobia.  No chest pain or shortness of breath.  No urinary symptoms.  No recent illnesses.  No recent change in appetite and bowel habits.  ED course and data reviewed.  Vitals and initial labs stable, except hyperglycemia.  CT head was negative for any acute abnormality.  CTA negative for LVO.  EKG. personally reviewed.  Normal sinus rhythm with occasional PVCs  Review of Systems: As mentioned in the history of present illness. All other systems reviewed and are negative. Past Medical History:  Diagnosis Date   ADHD (attention deficit hyperactivity disorder)    Back pain    Complication of anesthesia    Patient states he has been violent in the past after waking up from anesthsia   Diabetes mellitus without complication (HCC)    Hypertension    Past Surgical History:  Procedure Laterality Date   BIOPSY  12/17/2019   Procedure: BIOPSY;  Surgeon: Charna Elizabeth, MD;  Location: [facility] ENDOSCOPY;  Service: Endoscopy;;   COLONOSCOPY N/A 12/17/2019   Procedure: COLONOSCOPY;  Surgeon: Charna Elizabeth, MD;  Location: [facility] ENDOSCOPY;  Service: Endoscopy;  Laterality: N/A;   INSERTION OF MESH  N/A 08/26/2021   Procedure: INSERTION OF MESH;  Surgeon: Violeta Gelinas, MD;  Location: [facility] OR;  Service: General;  Laterality: N/A;   POLYPECTOMY  12/17/2019   Procedure: POLYPECTOMY;  Surgeon: Charna Elizabeth, MD;  Location: [facility] ENDOSCOPY;  Service: Endoscopy;;   UMBILICAL HERNIA REPAIR N/A 08/26/2021   Procedure: REPAIR UMBILICAL HERNIA WITH MESH;  Surgeon: Violeta Gelinas, MD;  Location: [facility] OR;  Service: General;  Laterality: N/A;   UMBILICAL HERNIA REPAIR     WISDOM TOOTH EXTRACTION Bilateral    Social History:  reports that he has quit smoking. His smoking use included cigars. His smokeless tobacco use includes snuff and chew. He reports current alcohol use. He reports that he does not use drugs.  Allergies  Allergen Reactions   Metformin And Related Diarrhea   Penicillins Other (See Comments)    Childhood allergy.  Has patient had a PCN reaction causing immediate rash, facial/tongue/throat swelling, SOB or lightheadedness with hypotension: NO Has patient had a PCN reaction causing severe rash involving mucus membranes or skin necrosis:  NO Has patient had a PCN reaction that required hospitalization No NO Has patient had a PCN reaction occurring within the last 10 years: NO If all of the above answers are "NO", then may pro    Family History  Problem Relation Age of Onset   Colon cancer Maternal Grandfather    Stomach cancer Maternal Grandfather     Prior to Admission medications   Medication Sig Start Date End Date Taking? Authorizing Provider  aspirin EC 81  MG tablet Take 1 tablet (81 mg total) by mouth daily. Swallow whole. 11/24/21   Orbie Pyo, MD  atorvastatin (LIPITOR) 40 MG tablet Take 1 tablet (40 mg total) by mouth daily. 11/24/21 02/22/22  Orbie Pyo, MD  clindamycin (CLEOCIN) 300 MG capsule Take 1 capsule (300 mg total) by mouth 3 (three) times daily. 08/28/22   Sherian Maroon A, PA  lisinopril (ZESTRIL) 10 MG tablet Take 1 tablet (10 mg total) by mouth daily.  11/24/21   Orbie Pyo, MD    Physical Exam: Vitals:   04/11/23 1519 04/11/23 1530 04/11/23 1600 04/11/23 1634  BP:  (!) 155/102 (!) 146/94 (!) 148/92  Pulse:  72 72 71  Resp:  (!) 23 15 18   Temp: 98.1 F (36.7 C)     TempSrc: Oral     SpO2:  97% 96% 97%  Weight:       General: Vital signs reviewed.  Patient is well-developed and well-nourished, in no acute distress and cooperative with exam.  Head: Normocephalic and atraumatic. Eyes: EOMI, conjunctivae normal, no scleral icterus.  Neck: Supple, trachea midline, normal ROM, no JVD, masses, thyromegaly, or carotid bruit present.  Cardiovascular: RRR, S1 normal, S2 normal, no murmurs, gallops, or rubs. Pulmonary/Chest: Clear to auscultation bilaterally, no wheezes, rales, or rhonchi. Abdominal: Soft, non-tender, non-distended, BS +, Extremities: No lower extremity edema bilaterally,  pulses symmetric and intact bilaterally.  Neurological: A&O x3, Strength is normal and symmetric bilaterally, cranial nerve II-XII are grossly intact, no focal motor deficit, sensory intact to light touch bilaterally.  Skin: Warm, dry and intact. No rashes or erythema. Psychiatric: Normal mood and affect.    Data Reviewed: Prior data reviewed as mentioned above  Assessment and Plan: * CVA (cerebral vascular accident) [facility]) Patient had an episode of left-sided numbness and weakness earlier in the morning around 7, initially symptoms resolved and then reoccurred around 1 PM which prompted him to come to ED. Initial CT head was negative for any acute abnormality CTA negative for LVO. -Patient is being admitted under observation to complete stroke workup -Continue home aspirin -Continue home crestor -MRI brain ordered -Lipid panel and A1c -Swallow evaluation-start diet after that -PT and OT evaluation -Echocardiogram  HTN (hypertension) Patient is on lisinopril and HCTZ at home. Currently holding home antihypertensives-we will restart if MRI is  negative    Advance Care Planning:   Code Status: Full Code   Consults: Neurology  Family Communication: Discussed with wife at bedside  Severity of Illness: The appropriate patient status for this patient is OBSERVATION. Observation status is judged to be reasonable and necessary in order to provide the required intensity of service to ensure the patient's safety. The patient's presenting symptoms, physical exam findings, and initial radiographic and laboratory data in the context of their medical condition is felt to place them at decreased risk for further clinical deterioration. Furthermore, it is anticipated that the patient will be medically stable for discharge from the hospital within 2 midnights of admission.   This record has been created using Conservation officer, historic buildings. Errors have been sought and corrected,but may not always be located. Such creation errors do not reflect on the standard of care.   Author: Arnetha Courser, MD 04/11/2023 6:11 PM  For on call review [email or web address].

## 2023-04-11 NOTE — Assessment & Plan Note (Addendum)
Patient is on lisinopril and HCTZ at home. Currently holding home antihypertensives-we will restart if MRI is negative

## 2023-04-11 NOTE — Code Documentation (Signed)
Mr. Kayl Martinez is a 37 yr old male with a PMH of smoking, ADHD, DM, HTN presenting to [facility] with a complaint of left sided numbness and weakness. Pt is from home where he was last known well this morning at 0700, after which time he has c/o weakness and numbness on the left as well as a headache off and on. This most current episode started at 1300.     Code stroke activated in triage. Pt labs and CBG obtained. Pt met by stroke team in CT. NIHSS 5. Pt with left facial droop, left arm and leg drift, left sensory loss, and dysarthria. (Please see documentation for NIHSS details and timeline). The following imaging was obtained: CT, CTA. CT head negative for acute abnormality per Dr. Iver Nestle. CTA neg for LVO per Dr. Iver Nestle.     Pt back to ED room 6 where his workup will continue. He will need q 2 hr VS and NIHSS for 12 hr, then q 4. Stroke swallow screen prior to any po intake. Bedside handoff with EDRN complete. Pt not candidate for thrombolytic as LKW outside of the treatment window. Pt ineligible for thrombectomy as LVO negative.

## 2023-04-11 NOTE — Assessment & Plan Note (Addendum)
Patient had an episode of left-sided numbness and weakness earlier in the morning around 7, initially symptoms resolved and then reoccurred around 1 PM which prompted him to come to ED. Initial CT head was negative for any acute abnormality CTA negative for LVO. -Patient is being admitted under observation to complete stroke workup -Continue home aspirin -Continue home crestor -MRI brain ordered -Lipid panel and A1c -Swallow evaluation-start diet after that -PT and OT evaluation -Echocardiogram

## 2023-04-12 ENCOUNTER — Observation Stay (HOSPITAL_BASED_OUTPATIENT_CLINIC_OR_DEPARTMENT_OTHER): Payer: BC Managed Care – PPO

## 2023-04-12 ENCOUNTER — Other Ambulatory Visit (HOSPITAL_COMMUNITY): Payer: Self-pay

## 2023-04-12 ENCOUNTER — Other Ambulatory Visit (HOSPITAL_COMMUNITY): Payer: BC Managed Care – PPO

## 2023-04-12 DIAGNOSIS — E1169 Type 2 diabetes mellitus with other specified complication: Secondary | ICD-10-CM

## 2023-04-12 DIAGNOSIS — R2990 Unspecified symptoms and signs involving the nervous system: Secondary | ICD-10-CM | POA: Diagnosis not present

## 2023-04-12 DIAGNOSIS — G459 Transient cerebral ischemic attack, unspecified: Secondary | ICD-10-CM | POA: Diagnosis present

## 2023-04-12 DIAGNOSIS — E785 Hyperlipidemia, unspecified: Secondary | ICD-10-CM | POA: Diagnosis present

## 2023-04-12 DIAGNOSIS — K5100 Ulcerative (chronic) pancolitis without complications: Secondary | ICD-10-CM

## 2023-04-12 DIAGNOSIS — E782 Mixed hyperlipidemia: Secondary | ICD-10-CM

## 2023-04-12 LAB — ECHOCARDIOGRAM COMPLETE
Area-P 1/2: 3.53 cm2
Calc EF: 52.1 %
Height: 74 in
S' Lateral: 2.9 cm
Single Plane A2C EF: 52.2 %
Single Plane A4C EF: 52.1 %
Weight: 4606.73 oz

## 2023-04-12 LAB — GLUCOSE, CAPILLARY
Glucose-Capillary: 271 mg/dL — ABNORMAL HIGH (ref 70–99)
Glucose-Capillary: 261 mg/dL — ABNORMAL HIGH (ref 70–99)
Glucose-Capillary: 250 mg/dL — ABNORMAL HIGH (ref 70–99)
Glucose-Capillary: 226 mg/dL — ABNORMAL HIGH (ref 70–99)

## 2023-04-12 LAB — LIPID PANEL
Cholesterol: 127 mg/dL (ref 0–200)
HDL: 24 mg/dL — ABNORMAL LOW (ref >40)
LDL Cholesterol: UNDETERMINED mg/dL (ref 0–99)
Total CHOL/HDL Ratio: 5.3 RATIO
Triglycerides: 531 mg/dL — ABNORMAL HIGH (ref <150)
VLDL: UNDETERMINED mg/dL (ref 0–40)

## 2023-04-12 LAB — LDL CHOLESTEROL, DIRECT: Direct LDL: 39 mg/dL (ref 0–99)

## 2023-04-12 MED ORDER — PANTOPRAZOLE SODIUM 40 MG PO TBEC: 40.0000 mg | DELAYED_RELEASE_TABLET | Freq: Every day | ORAL | 3 refills | Status: DC

## 2023-04-12 MED ORDER — INSULIN SYRINGE-NEEDLE U-100 25G X 5/8" 1 ML MISC
1.0000 | Freq: Three times a day (TID) | 0 refills | Status: AC
Start: 2023-04-12 — End: ?

## 2023-04-12 MED ORDER — LANCET DEVICE MISC
1.0000 | Freq: Three times a day (TID) | 0 refills | Status: DC
Start: 2023-04-12 — End: 2023-06-07

## 2023-04-12 MED ORDER — NOVOLIN 70/30 (70-30) 100 UNIT/ML ~~LOC~~ SUSP: 22.0000 [IU] | Freq: Two times a day (BID) | SUBCUTANEOUS | 11 refills | Status: AC

## 2023-04-12 MED ORDER — INSULIN STARTER KIT- PEN NEEDLES (ENGLISH)
1.0000 | Freq: Once | Status: AC
  Administered 2023-04-12: 1
  Filled 2023-04-12: qty 1

## 2023-04-12 MED ORDER — INSULIN GLARGINE-YFGN 100 UNIT/ML ~~LOC~~ SOLN
20.0000 [IU] | Freq: Every day | SUBCUTANEOUS | Status: DC
  Administered 2023-04-12: 20 [IU] via SUBCUTANEOUS
  Filled 2023-04-12: qty 0.2

## 2023-04-12 MED ORDER — CLOPIDOGREL BISULFATE 75 MG PO TABS: 75.0000 mg | ORAL_TABLET | Freq: Every day | ORAL | 11 refills | Status: AC

## 2023-04-12 MED ORDER — ROSUVASTATIN CALCIUM 20 MG PO TABS
40.0000 mg | ORAL_TABLET | Freq: Every day | ORAL | Status: DC
  Administered 2023-04-12: 40 mg via ORAL
  Filled 2023-04-12: qty 2

## 2023-04-12 MED ORDER — HYDROCHLOROTHIAZIDE 12.5 MG PO TABS
12.5000 mg | ORAL_TABLET | Freq: Every day | ORAL | Status: DC
  Administered 2023-04-12: 12.5 mg via ORAL
  Filled 2023-04-12: qty 1

## 2023-04-12 MED ORDER — LANCETS MISC
1.0000 | Freq: Three times a day (TID) | 0 refills | Status: DC
Start: 2023-04-12 — End: 2023-06-07

## 2023-04-12 MED ORDER — LIVING WELL WITH DIABETES BOOK
Freq: Once | Status: AC
  Filled 2023-04-12: qty 1

## 2023-04-12 MED ORDER — ROSUVASTATIN CALCIUM 40 MG PO TABS: 40.0000 mg | ORAL_TABLET | Freq: Every day | ORAL | 3 refills | Status: AC

## 2023-04-12 MED ORDER — BLOOD GLUCOSE MONITORING SUPPL DEVI
1.0000 | Freq: Three times a day (TID) | 0 refills | Status: AC
Start: 2023-04-12 — End: ?

## 2023-04-12 MED ORDER — BLOOD GLUCOSE TEST VI STRP: 1.0000 | ORAL_STRIP | Freq: Three times a day (TID) | 0 refills | Status: DC

## 2023-04-12 MED ORDER — LISINOPRIL-HYDROCHLOROTHIAZIDE 10-12.5 MG PO TABS: 1.0000 | ORAL_TABLET | Freq: Every day | ORAL | Status: DC

## 2023-04-12 MED ORDER — LISINOPRIL 10 MG PO TABS
10.0000 mg | ORAL_TABLET | Freq: Every day | ORAL | Status: DC
  Administered 2023-04-12: 10 mg via ORAL
  Filled 2023-04-12: qty 1

## 2023-04-12 MED ORDER — LISINOPRIL-HYDROCHLOROTHIAZIDE 10-12.5 MG PO TABS: 1.0000 | ORAL_TABLET | Freq: Every day | ORAL | 3 refills | Status: AC

## 2023-04-12 MED ORDER — ASPIRIN EC 81 MG PO TBEC: 81.0000 mg | DELAYED_RELEASE_TABLET | Freq: Every day | ORAL | 0 refills | Status: AC

## 2023-04-12 NOTE — Progress Notes (Signed)
[facility]                                                                              Champion Phelan, is a 37 y.o. male, DOB - 1986-04-05, ZOX:[id number] Admit date - 04/11/2023    Outpatient Primary MD for the patient is Makena, [place], [place]  LOS - 0  days  Chief Complaint  Patient presents with   Code Stroke       Brief summary   Patient is a 37 year old male with hypertension, ADHD, diabetes mellitus, hyperlipidemia presented after having 2 episodes of left-sided numbness and weakness.  Patient also reported some mild intermittent headache, no change in vision, nausea or vomiting.  No prior history of migraine headaches.   CTA negative for LVO, CT head negative for acute abnormality. Patient was admitted for stroke workup Assessment & Plan    Principal Problem:   TIA (transient ischemic attack) -Presented with 2 episodes of left-sided numbness and weakness, headache, improved -CT head negative for acute CVA, CTA with no emergent large vessel occlusion or proximal hemodynamically significant stenosis -MRI brain negative for acute CVA -Patient was placed on aspirin, Plavix, so far no acute issues.  However he states that he cannot take aspirin due to GI issues and history of ulcerative colitis.   -Follow 2D echo -PT OT SLP evaluation -Lipid panel showed cholesterol 127, HDL 24, triglycerides 531, LDL unable to calculate -Per patient, he has not tolerated atorvastatin in the past, placed on Crestor 40 mg daily -Hemoglobin A1c 10.5  Active Problems:   Type 2 diabetes mellitus (HCC), uncontrolled with hyperglycemia, IDDM with complication of TIA -Hemoglobin A1c 10.5 CBG (last 3)  Recent Labs    04/12/23 0614 04/12/23 0723 04/12/23 1114  GLUCAP 271* 261* 250*  -CBGs uncontrolled, diabetic coordinator consult -Continue NovoLog sliding scale insulin, added Semglee 20 units daily -Per patient he takes Novolin R 10 units in the morning and after  supper as 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  Was prescribed as Novolin R 10 units 3 times daily.     HTN (hypertension) -Resume lisinopril hydrochlorothiazide   History of pancolitis (HCC) -Unclear if patient has ulcerative colitis. Patient was admitted in 2021, underwent colonoscopy and biopsy had shown increased eosinophils in the epithelium and in the lamina propria, no features of chronicity, nonspecific and differentials include eosinophilic colitis, collagen vascular disease, drug reaction, parasitosis and idiopathic eosinophilic syndrome. He did not follow-up with Dr. Loreta Ave who did the inpatient colonoscopy, followed with [facility] Celso Amy, PA, last seen her in August 2022.  Had a "flare" in May 2022 which was treated with Cipro and Flagyl, budesonide (insurance did not cover) and was put on mesalamine, currently not on the [facility].  She had mentioned in her note as suspected ulcerative colitis, CT abdomen in 10/2021 was normal without any abdominal pathology. -Requested [facility] to clarify ulcerative colitis diagnosis and if patient can be on DAPT for TIA especially with extensive risk factors      Hyperlipidemia -Lipid panel showed cholesterol 127, HDL 24, triglycerides 531, LDL unable to calculate -Per patient, he has not tolerated  atorvastatin in the past, placed on Crestor 40 mg daily  Obesity Estimated body mass index is 36.97 kg/m as calculated from the following:   Height as of this encounter: 6\' 2"  (1.88 m).   Weight as of this encounter: 130.6 kg.  Code Status: Full CODE STATUS DVT Prophylaxis:  enoxaparin (LOVENOX) injection 40 mg Start: 04/11/23 1800   Level of Care: Level of care: Telemetry Medical Family Communication: Updated patient's wife at the bedside Disposition Plan:      Remains inpatient appropriate: Once 2D echo completed, need GI and neurology recommendations prior to discharge regarding aspirin/Plavix   Procedures:    Consultants:    [facility]urology [facility]  Antimicrobials:   Anti-infectives (From admission, onward)    None          Medications  aspirin EC  81 mg Oral Daily   enoxaparin (LOVENOX) injection  40 mg Subcutaneous Q24H   gabapentin  300 mg Oral QHS   insulin aspart  0-15 Units Subcutaneous TID WC   insulin aspart  0-5 Units Subcutaneous QHS   insulin starter kit- pen needles  1 kit Other Once   living well with diabetes book   Does not apply Once   pantoprazole  40 mg Oral Daily   rosuvastatin  40 mg Oral Daily      Subjective:   Jailin Cargile was seen and examined today.  No new complaints, left-sided numbness, weakness improving, no headache.  No chest pain, shortness of breath, dizziness lightheadedness.  Speech clear.  No acute events overnight.    Objective:   Vitals:   04/11/23 2013 04/11/23 2333 04/12/23 0411 04/12/23 0800  BP: (!) 154/104 (!) 157/102 (!) 144/94 (!) 156/105  Pulse: 71 76 65 73  Resp: 18     Temp: 98.1 F (36.7 C) 97.8 F (36.6 C) 97.7 F (36.5 C) 98 F (36.7 C)  TempSrc: Oral Oral Oral Oral  SpO2: 98% 94% 98% 98%  Weight: 130.6 kg     Height: 6\' 2"  (1.88 m)       Intake/Output Summary (Last 24 hours) at 04/12/2023 1107 Last data filed at 04/12/2023 0800 Gross per 24 hour  Intake 118 ml  Output --  Net 118 ml     Wt Readings from Last 3 Encounters:  04/11/23 130.6 kg  08/28/22 127 kg  11/24/21 (!) 138.3 kg     Exam General: Alert and oriented x 3, NAD Cardiovascular: S1 S2 auscultated,  RRR Respiratory: Clear to auscultation bilaterally, no wheezing Gastrointestinal: Soft, nontender, nondistended, + bowel sounds Ext: no pedal edema bilaterally Neuro: Strength 5/5 upper and lower extremities bilaterally Psych: Normal affect     Data Reviewed:  I have personally reviewed following labs    CBC Lab Results  Component Value Date   WBC 9.7 04/11/2023   RBC 5.22 04/11/2023   HGB 15.8 04/11/2023   HCT 43.7 04/11/2023   MCV 83.7  04/11/2023   MCH 30.3 04/11/2023   PLT 238 04/11/2023   MCHC 36.2 (H) 04/11/2023   RDW 13.3 04/11/2023   LYMPHSABS 3.8 04/11/2023   MONOABS 0.6 04/11/2023   EOSABS 0.4 04/11/2023   BASOSABS 0.1 04/11/2023     Last metabolic panel Lab Results  Component Value Date   NA 136 04/11/2023   K 4.0 04/11/2023   CL 103 04/11/2023   CO2 22 04/11/2023   BUN 13 04/11/2023   CREATININE 0.90 04/11/2023   GLUCOSE 335 (H) 04/11/2023   GFRNONAA >60 04/11/2023   GFRAA >  60 01/03/2020   CALCIUM 9.2 04/11/2023   PROT 7.0 04/11/2023   ALBUMIN 3.7 04/11/2023   BILITOT 0.7 04/11/2023   ALKPHOS 105 04/11/2023   AST 24 04/11/2023   ALT 32 04/11/2023   ANIONGAP 11 04/11/2023    CBG (last 3)  Recent Labs    04/11/23 2207 04/12/23 0614 04/12/23 0723  GLUCAP 356* 271* 261*      Coagulation Profile: Recent Labs  Lab 04/11/23 1443  INR 0.9     Radiology Studies: I have personally reviewed the imaging studies  MR BRAIN WO CONTRAST  Result Date: 04/11/2023 CLINICAL DATA:  Stroke, follow up EXAM: MRI HEAD WITHOUT CONTRAST TECHNIQUE: Multiplanar, multiecho pulse sequences of the brain and surrounding structures were obtained without intravenous contrast. COMPARISON:  Same day CT head FINDINGS: Brain: Negative for an acute infarct. No hemorrhage. No extra-axial fluid collection. No hydrocephalus. Mega cisterna magna. No mass lesion. No mass effect. Vascular: Normal flow voids. Skull and upper cervical spine: Normal marrow signal. Sinuses/Orbits: No middle ear or mastoid effusion. Paranasal sinuses are clear. Orbits are unremarkable. Other: None. IMPRESSION: No acute intracranial process. Electronically Signed   By: Lorenza Cambridge M.D.   On: 04/11/2023 18:09   CT ANGIO HEAD NECK W WO CM (CODE STROKE)  Result Date: 04/11/2023 CLINICAL DATA:  Neuro deficit, acute, stroke suspected EXAM: CT ANGIOGRAPHY HEAD AND NECK WITH AND WITHOUT CONTRAST TECHNIQUE: Multidetector CT imaging of the head and neck  was performed using the standard protocol during bolus administration of intravenous contrast. Multiplanar CT image reconstructions and MIPs were obtained to evaluate the vascular anatomy. Carotid stenosis measurements (when applicable) are obtained utilizing NASCET criteria, using the distal internal carotid diameter as the denominator. RADIATION DOSE REDUCTION: This exam was performed according to the departmental dose-optimization program which includes automated exposure control, adjustment of the mA and/or kV according to patient size and/or use of iterative reconstruction technique. CONTRAST:  75mL OMNIPAQUE IOHEXOL 350 MG/ML SOLN COMPARISON:  Same day CT head. FINDINGS: CTA NECK FINDINGS Aortic arch: Great vessel origins are patent without significant stenosis. Right carotid system: No evidence of dissection, stenosis (50% or greater), or occlusion. Left carotid system: No evidence of dissection, stenosis (50% or greater), or occlusion. Vertebral arteries: Codominant. No evidence of dissection, stenosis (50% or greater), or occlusion. Skeleton: No acute abnormality on limited assessment. Other neck: No acute abnormality on limited assessment. Upper chest: Visualized lung apices are clear. Review of the MIP images confirms the above findings CTA HEAD FINDINGS Anterior circulation: Bilateral intracranial ICAs, MCAs, and ACAs are patent without proximal hemodynamically significant stenosis. No aneurysm identified. Posterior circulation:  Bilateral intradural vertebral arteries, Venous sinuses: As permitted by contrast timing, patent. Review of the MIP images confirms the above findings IMPRESSION: No emergent large vessel occlusion or proximal hemodynamically significant stenosis. Electronically Signed   By: Feliberto Harts M.D.   On: 04/11/2023 15:13   CT HEAD CODE STROKE WO CONTRAST  Result Date: 04/11/2023 CLINICAL DATA:  Code stroke.  Neuro deficit, acute, stroke suspected EXAM: CT HEAD WITHOUT  CONTRAST TECHNIQUE: Contiguous axial images were obtained from the base of the skull through the vertex without intravenous contrast. RADIATION DOSE REDUCTION: This exam was performed according to the departmental dose-optimization program which includes automated exposure control, adjustment of the mA and/or kV according to patient size and/or use of iterative reconstruction technique. COMPARISON:  None Available. FINDINGS: Brain: No evidence of acute infarction, hemorrhage, hydrocephalus, extra-axial collection or mass lesion/mass effect. Vascular: No hyperdense vessel.  Skull: No acute fracture. Sinuses/Orbits: Clear sinuses.  No acute orbital findings. Total score (0-10 with 10 being normal): 10. IMPRESSION: 1. No evidence of acute intracranial abnormality. 2. ASPECTS is 10. Code stroke imaging results were communicated on 04/11/2023 at 3:06 pm to provider Bhagat via secure text paging. Electronically Signed   By: Feliberto Harts M.D.   On: 04/11/2023 15:07       Indi Willhite M.D. [facility] 04/12/2023, 11:07 AM  Available via Epic secure chat 7am-7pm After 7 pm, please refer to night coverage provider listed on amion.

## 2023-04-12 NOTE — Progress Notes (Signed)
  Echocardiogram 2D Echocardiogram has been performed.  Janalyn Harder 04/12/2023, 3:37 PM

## 2023-04-12 NOTE — Progress Notes (Signed)
OT Cancellation Note and Discharge  Patient Details Name: Patrick Munoz MRN: [id number] DOB: [birth year]   Cancelled Treatment:    Reason Eval/Treat Not Completed: OT screened, no needs identified, will sign off.Received secure chat test from evaluating PT, Anessa Pettis--"Pt is at baseline functionally with sensation, coordination, and strength intact and symmetrical bil throughout. However, pt is demonstrating slow processing and memory deficits that the wife is concerned about. Reportedly the memory issues began around May though so it is not very acute, but still a concern if you do not mind evaluating that". In continuing to secure chat noted pt had an SLP order that was screened earlier today so spoke with the SLP, Kendal Hymen Debloise about the info above from PT and SLP with put their order back in (SLP does more extensive cognitive testing than OT). Did ask evaluating PT to let pt/family know that if SLP did not get to them before discharge to either follow up with pt's primary care provider about this or neurologist if they have one post this admission.   Lindon Romp OT Acute Rehabilitation Services Office [phone]    Evette Georges 04/12/2023, 10:02 AM

## 2023-04-12 NOTE — Progress Notes (Signed)
SLP Cancellation Note  Patient Details Name: DELLON REVETTE MRN: [id number] DOB: [birth year]   Cancelled treatment:       Reason Eval/Treat Not Completed: SLP screened, no needs identified, will sign off. Imaging negative for CVA. Please reorder if SLP needs arise.    Audrey Eller, Riley Nearing 04/12/2023, 7:58 AM

## 2023-04-12 NOTE — Evaluation (Signed)
Speech Language Pathology Evaluation Patient Details Name: Patrick Munoz MRN: [id number] DOB: 1986-02-18 Today's Date: 04/12/2023 Time: 4098-1191 SLP Time Calculation (min) (ACUTE ONLY): 15 min  Problem List:  Patient Active Problem List   Diagnosis Date Noted   TIA (transient ischemic attack) 04/12/2023   Hyperlipidemia 04/12/2023   CVA (cerebral vascular accident) (HCC) 04/11/2023   Stroke-like symptoms 04/11/2023   History of colonic polyps    Obesity, Class III, BMI 40-49.9 (morbid obesity) (HCC) 12/19/2019   Pancolitis (HCC) 12/16/2019   SIRS (systemic inflammatory response syndrome) (HCC) 12/16/2019   Polycythemia 12/16/2019   Type 2 diabetes mellitus (HCC) 12/16/2019   HTN (hypertension) 12/16/2019   Past Medical History:  Past Medical History:  Diagnosis Date   ADHD (attention deficit hyperactivity disorder)    Back pain    Complication of anesthesia    Patient states he has been violent in the past after waking up from anesthsia   Diabetes mellitus without complication (HCC)    Hypertension    Past Surgical History:  Past Surgical History:  Procedure Laterality Date   BIOPSY  12/17/2019   Procedure: BIOPSY;  Surgeon: Charna Elizabeth, MD;  Location: [facility] ENDOSCOPY;  Service: Endoscopy;;   COLONOSCOPY N/A 12/17/2019   Procedure: COLONOSCOPY;  Surgeon: Charna Elizabeth, MD;  Location: [facility] ENDOSCOPY;  Service: Endoscopy;  Laterality: N/A;   INSERTION OF MESH N/A 08/26/2021   Procedure: INSERTION OF MESH;  Surgeon: Violeta Gelinas, MD;  Location: [facility] OR;  Service: General;  Laterality: N/A;   POLYPECTOMY  12/17/2019   Procedure: POLYPECTOMY;  Surgeon: Charna Elizabeth, MD;  Location: [facility] ENDOSCOPY;  Service: Endoscopy;;   UMBILICAL HERNIA REPAIR N/A 08/26/2021   Procedure: REPAIR UMBILICAL HERNIA WITH MESH;  Surgeon: Violeta Gelinas, MD;  Location: [facility] OR;  Service: General;  Laterality: N/A;   UMBILICAL HERNIA REPAIR     WISDOM TOOTH EXTRACTION Bilateral    HPI:  Patient is  a 37 y.o. male with PMH: ADHD, DM, HTN who came to ED on 04/11/2023 after having two episodes of left sided numbness and weakness. CT head and MRI brain both negative for acute intracranial abnormality. SLP initially screened and s/o on patient due to negative imaging, however PT reported slow processing and memory deficits that patient's spouse was concerned about, and evaluation orders put back in.   Assessment / Plan / Recommendation Clinical Impression  SLP assessed patient's cognitive-linguistic functioning secondary to concerns from PT as well as patient's spouse. When SLP arrived, patient in bed, affect flat and voice monotone. His spouse was in the room as well. They both report that for approximately the past 4-6 weeks, patient has been having memory difficulty where he forgets things he has been told. His spouse stated that this has led to arguements between the two of them. Patient added, "I don't remember much of my childhood" and when asked some follow up questions, he said that he has never had much memory of his childhood. He denied concussions and denied any significant event that occured to explain his memory deficit. SLP administered the SLUMS examination and patient received a score of 29 out of 30, placing him in the 'Normal' category for cognition. He only missed one of the 5 delayed recall words. Difficult to determine if patient has had any cognitive changes and aside from flat affect/monotone voice, no other observed cognitive-linguistic impairments. SLP is recommending patient f/u with OP Neurology.    SLP Assessment  SLP Recommendation/Assessment: Patient does not need any further Speech Lanaguage  Pathology Services    Recommendations for follow up therapy are one component of a multi-disciplinary discharge planning process, led by the attending physician.  Recommendations may be updated based on patient status, additional functional criteria and insurance authorization.    Follow  Up Recommendations  No SLP follow up    Assistance Recommended at Discharge  None  Functional Status Assessment Patient has not had a recent decline in their functional status  Frequency and Duration   N/A        SLP Evaluation Cognition  Overall Cognitive Status: Difficult to assess Arousal/Alertness: Awake/alert Orientation Level: Oriented X4 Year: 2024 Month: June Day of Week: Correct Memory: Appears intact Awareness: Appears intact Problem Solving: Appears intact Safety/Judgment: Appears intact       Comprehension  Auditory Comprehension Overall Auditory Comprehension: Appears within functional limits for tasks assessed    Expression Expression Primary Mode of Expression: Verbal Verbal Expression Overall Verbal Expression: Appears within functional limits for tasks assessed Pragmatics: Impairment Impairments: Abnormal affect;Monotone   Oral / Motor  Oral Motor/Sensory Function Overall Oral Motor/Sensory Function: Within functional limits Motor Speech Overall Motor Speech: Appears within functional limits for tasks assessed Respiration: Within functional limits Resonance: Within functional limits Articulation: Within functional limitis Intelligibility: Intelligible Motor Planning: Witnin functional limits Motor Speech Errors: Not applicable           Angela Nevin, MA, CCC-SLP Speech Therapy

## 2023-04-12 NOTE — Inpatient Diabetes Management (Addendum)
Inpatient Diabetes Program Recommendations  AACE/ADA: New Consensus Statement on Inpatient Glycemic Control (2015)  Target Ranges:  Prepandial:   less than 140 mg/dL      Peak postprandial:   less than 180 mg/dL (1-2 hours)      Critically ill patients:  140 - 180 mg/dL   Lab Results  Component Value Date   GLUCAP 261 (H) 04/12/2023   HGBA1C 10.5 (H) 04/11/2023    Review of Glycemic Control  Latest Reference Range & Units 04/11/23 22:07 04/12/23 06:14 04/12/23 07:23  Glucose-Capillary 70 - 99 mg/dL 161 (H) 096 (H) 045 (H)  (H): Data is abnormally high Diabetes history: Type 2 DM Outpatient Diabetes medications: Novolin R 10 units TID Current orders for Inpatient glycemic control: Novolog 0-15 units TID & HS  Inpatient Diabetes Program Recommendations:    Consider adding Semglee 20 units every day.  Secure chat sent to MD and pharm regarding benefits checks.  LWWDM booklet, dietitian consult and insulin starter kit ordered. Outpt edu also placed. Will plan to see.   Addendum: Spoke with patient and wife regarding outpatient diabetes management.  Reviewed patient's current A1c of 10.5%. Explained what a A1c is and what it measures. Also reviewed goal A1c with patient, importance of good glucose control @ home, and blood sugar goals. Reviewed patho of DM, role of pancreas, need for insulin, survival skills, interventions, differences between basal vs short acting vs 70/30, vascular changes and commorbidities.  Patient will need a meter at discharge. Also, reviewed CGM with patient; interested. Reviewed risks/benefits, how to install application, when to reach out to MD, and how to apply. Applied to patient's upper left arm.  Admits to drinking sugary beverages and room for improvement with diet. Dietitian consult placed. Reviewed plate method, basic carb counting, importance of protein and reading nutritional labels.  Educated patient and spouse on insulin pen use at home. Reviewed  contents of insulin flexpen starter kit. Reviewed all steps if insulin pen including attachment of needle, 2-unit air shot, dialing up dose, giving injection, removing needle, disposal of sharps, storage of unused insulin, disposal of insulin etc. Patient able to provide successful return demonstration. Also reviewed troubleshooting with insulin pen. MD to give patient Rxs for insulin pens and insulin pen needles.  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  In preparation for discharge, recommend Novolin 70/30 22 units BID.    Thanks, Lujean Rave, MSN, RNC-OB Diabetes Coordinator [phone] (8a-5p)

## 2023-04-12 NOTE — TOC Benefit Eligibility Note (Signed)
Pharmacy Patient Advocate Encounter  Insurance verification completed.    The patient is insured through [facility]    Ran test claim for [facility] and the current 30 day co-pay is $35.00.   This test claim was processed through [facility]- copay amounts may vary at other pharmacies due to pharmacy/plan contracts, or as the patient moves through the different stages of their insurance plan.

## 2023-04-12 NOTE — Plan of Care (Signed)

## 2023-04-12 NOTE — Progress Notes (Addendum)
STROKE TEAM PROGRESS NOTE   INTERVAL HISTORY His spouse is at the bedside.    Patient reported that he developed L arm and leg numbness on the day of admission this did not involve his face, but did involve his entire arm. He was still able to ambulate and infact continued to work. He states these symptoms lasted for a couple of hours and then resolved. 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 He presented to the ED and the numbness did not resolve until he was given plavix.   When he developed arm numbness only he also had a HA that felt like his previous migraines though he had no aura or vision changes. He felt this was related to migraine.   Vitals:   04/11/23 2333 04/12/23 0411 04/12/23 0800 04/12/23 1218  BP: (!) 157/102 (!) 144/94 (!) 156/105 (!) 152/102  Pulse: 76 65 73 78  Resp:    18  Temp: 97.8 F (36.6 C) 97.7 F (36.5 C) 98 F (36.7 C) 97.7 F (36.5 C)  TempSrc: Oral Oral Oral Oral  SpO2: 94% 98% 98% 95%  Weight:      Height:       CBC:  Recent Labs  Lab 04/11/23 1443 04/11/23 1453 04/11/23 2106  WBC 10.1  --  9.7  NEUTROABS 5.2  --   --   HGB 16.4 16.0 15.8  HCT 46.4 47.0 43.7  MCV 82.6  --  83.7  PLT 246  --  238   Basic Metabolic Panel:  Recent Labs  Lab 04/11/23 1443 04/11/23 1453 04/11/23 2106  NA 134* 136  --   K 4.0 4.0  --   CL 101 103  --   CO2 22  --   --   GLUCOSE 322* 335*  --   BUN 11 13  --   CREATININE 0.69 0.50* 0.90  CALCIUM 9.2  --   --    Lipid Panel:  Recent Labs  Lab 04/12/23 0350  CHOL 127  TRIG 531*  HDL 24*  CHOLHDL 5.3  VLDL UNABLE TO CALCULATE IF TRIGLYCERIDE OVER 400 mg/dL  LDLCALC UNABLE TO CALCULATE IF TRIGLYCERIDE OVER 400 mg/dL   LDL 39  JYNW2N:  Recent Labs  Lab 04/11/23 2106  HGBA1C 10.5*   Urine Drug Screen: No results for input(s): "LABOPIA", "COCAINSCRNUR", "LABBENZ", "AMPHETMU", "THCU", "LABBARB" in the last  168 hours.  Alcohol Level  Recent Labs  Lab 04/11/23 1443  ETH <10    IMAGING past 24 hours MR BRAIN WO CONTRAST  Result Date: 04/11/2023 CLINICAL DATA:  Stroke, follow up EXAM: MRI HEAD WITHOUT CONTRAST TECHNIQUE: Multiplanar, multiecho pulse sequences of the brain and surrounding structures were obtained without intravenous contrast. COMPARISON:  Same day CT head FINDINGS: Brain: Negative for an acute infarct. No hemorrhage. No extra-axial fluid collection. No hydrocephalus. Mega cisterna magna. No mass lesion. No mass effect. Vascular: Normal flow voids. Skull and upper cervical spine: Normal marrow signal. Sinuses/Orbits: No middle ear or mastoid effusion. Paranasal sinuses are clear. Orbits are unremarkable. Other: None. IMPRESSION: No acute intracranial process. Electronically Signed   By: Lorenza Cambridge M.D.   On: 04/11/2023 18:09    PHYSICAL EXAM Physical Exam  Constitutional: Appears obese young Caucasian male not in distress Psych: Affect appropriate to situation Eyes: No scleral injection HENT: No OP obstrucion Head: Normocephalic.  Cardiovascular: Normal rate and regular  rhythm.  Respiratory: Effort normal and breath sounds normal to anterior ascultation GI: Soft.  No distension. There is no tenderness.  Skin: WDI  Neuro: Mental Status: Patient is awake, alert, oriented to person, place, month, year, and situation. Patient is able to give a clear and coherent history. No signs of aphasia or neglect Cranial Nerves: II: Visual Fields are full. Pupils are equal, round, and reactive to light.   III,IV, VI: EOMI without ptosis or diplopia.  V: Facial sensation is symmetric to light touch VII: Face is symmetric resting and smiling.  VIII: Hearing is intact to voice X: Palate is midline and palate elevates symmetrically, phonation intact XI: Shoulder shrug is symmetric. XII: tongue is midline without atrophy or fasciculations.  Motor: Tone is normal. Bulk is normal. 5/5  strength was present in all four extremities.  Sensory: Sensation is symmetric to light touch  Cerebellar: FNF and HKS are intact bilaterally   ASSESSMENT/PLAN Patrick Munoz is a 37 y.o. male with history of poorly controlled T2DM, HTN, and occasional tobacco use  presenting with L sided numbness and HA with no imaging evidence of CVA concerning for atypical migraine v TIA .   Right hemispheric subcortical TIA from small vessel disease versus atypical migraine  CTA head & neck no LVO  MRI  no evidence of stroke  2D Echo pending  LDL UNABLE TO CALCULATE IF TRIGLYCERIDE OVER 400 mg/dL, direct LDL 39 [id number] 04.5 VTE prophylaxis - lovenox     Diet   Diet heart healthy/carb modified Room service appropriate? Yes; Fluid consistency: Thin   No antithrombotic prior to admission, now on aspirin 81 mg daily and clopidogrel 75 mg daily. Continue DAPT for 3 weeks, then plavix alone. Therapy recommendations:  No PT follow up  Disposition:  Home   Hypertension Home meds:  lisinopril 10mg , hydrochlorothiazide 12.5mg   Stable Long-term BP goal normotensive  Hyperlipidemia Home meds:  crestor 5mg  3d/week, resumed in hospital LDL UNABLE TO CALCULATE IF TRIGLYCERIDE OVER 400 mg/dL, goal < 70 Need outpatient management of hypertriglyceridemia  Continue statin at discharge  Diabetes type II Unc\ontrolled Home meds:  Novolin 10u BID HgbA1c 10.5, goal < 7.0 CBGs Recent Labs    04/12/23 0614 04/12/23 0723 04/12/23 1114  GLUCAP 271* 261* 250*    Will need aggressive management of this in outpatient setting   Other Stroke Risk Factors ETOH use, alcohol level <10, advised to drink no more than 1-2 drink(s) a day Substance abuse - UDS:  THC No results found for requested labs within last 1095 days., Cocaine No results found for requested labs within last 1095 days.. Patient advised to stop using due to stroke risk. Obesity, Body mass index is 36.97 kg/m., BMI >/= 30 associated with  increased stroke risk, recommend weight loss, diet and exercise as appropriate  Yes Migraines Likely Obstructive sleep apnea, no recent sleep study, referral sent   Hospital day # 0  Marolyn Haller, MD PGY-3 Internal Medicine Resident  Pager [phone]  STROKE MD NOTE :  I have personally obtained history,examined this patient, reviewed notes, independently viewed imaging studies, participated in medical decision making and plan of care.ROS completed by me personally and pertinent positives fully documented  I have made any additions or clarifications directly to the above note. Agree with note above.  Patient presented with left sided paresthesias in the setting of mild headache.  Unclear if this happens her typical migraine or bihemispheric subcortical TIA from small vessel disease.  Recommend aspirin and  Plavix for 3 weeks followed by Plavix alone and aggressive risk factor modification.  Outpatient polysomnogram for sleep apnea as he appears to be at high risk.  Long discussion with patient and wife and answered questions.  Discussed with Dr. Isidoro Donning.  Greater than 50% time during this 50-minute visit was spent in counseling and coordination of care about his TIA versus complicated migraine and risk of stroke discussion and answering questions.  Delia Heady, MD Medical Director [facility] Pager: [phone] 04/12/2023 4:33 PM   To contact Stroke Continuity provider, please refer to [email or web address]. After hours, contact General Neurology

## 2023-04-12 NOTE — Evaluation (Signed)
Physical Therapy Evaluation & Discharge Patient Details Name: Patrick Munoz MRN: [id number] DOB: 1986/05/04 Today's Date: 04/12/2023  History of Present Illness  Pt is a 37 y.o. male who presented 04/11/23 after x2 episodes of L-sided numbness and weakness. MRI negative for acute intracranial process. PMH: HTN, ADHD, DM   Clinical Impression  Pt presents with condition above. PTA, he was independent, driving, working, and living with his family in a 2-level house with 1 STE and his bedroom on the 2nd level of the house. Pt is demonstrating WFL, intact, and symmetrical bil upper and lower extremity strength, coordination, and sensation. He is demonstrating his baseline functional mobility, not needing any assistance or displaying LOB. His DGI score of 23/24 is suggestive of him not being at risk for falls. He does display deficits in processing speed at this time and his wife reports pt has had memory issues since May. He could benefit from a cognitive assessment. As pt is at his functional baseline, no further PT needs identified. All education completed and questions answered. PT will sign off.      Recommendations for follow up therapy are one component of a multi-disciplinary discharge planning process, led by the attending physician.  Recommendations may be updated based on patient status, additional functional criteria and insurance authorization.  Follow Up Recommendations       Assistance Recommended at Discharge PRN  Patient can return home with the following  Assistance with cooking/housework;Direct supervision/assist for medications management;Direct supervision/assist for financial management;Assist for transportation    Equipment Recommendations None recommended by PT  Recommendations for Other Services  Speech consult (for cognition)    Functional Status Assessment Patient has not had a recent decline in their functional status     Precautions / Restrictions  Precautions Precautions: None Restrictions Weight Bearing Restrictions: No      Mobility  Bed Mobility Overal bed mobility: Modified Independent             General bed mobility comments: HOB elevated, no assistance needed    Transfers Overall transfer level: Independent Equipment used: None               General transfer comment: No assistance needed, no LOB    Ambulation/Gait Ambulation/Gait assistance: Supervision, Independent Gait Distance (Feet): 315 Feet Assistive device: None Gait Pattern/deviations: WFL(Within Functional Limits) Gait velocity: WFL Gait velocity interpretation: >4.37 ft/sec, indicative of normal walking speed   General Gait Details: Supervision provided initially for safety as this was the first session for this pt, but pt capable of being independent, demonstrating WFL gait pattern and cadence. No LOB, even with DGI challenges.  Stairs Stairs: Yes Stairs assistance: Supervision Stair Management: One rail Right, One rail Left, Alternating pattern, Forwards Number of Stairs: 4 General stair comments: Ascends with R rail and descends with L to simulate home set-up. Pt brushing heel against 2nd step when descending, resulting in minor sway but no LOB, supervision for safety. This did not occur again with x2 additional steps.  Wheelchair Mobility    Modified Rankin (Stroke Patients Only) Modified Rankin (Stroke Patients Only) Pre-Morbid Rankin Score: No symptoms Modified Rankin: No significant disability     Balance Overall balance assessment: No apparent balance deficits (not formally assessed)                               Standardized Balance Assessment Standardized Balance Assessment : Dynamic Gait Index   Dynamic Gait  Index Level Surface: Normal Change in Gait Speed: Normal Gait with Horizontal Head Turns: Normal Gait with Vertical Head Turns: Normal Gait and Pivot Turn: Normal Step Over Obstacle:  Normal Step Around Obstacles: Normal Steps: Mild Impairment Total Score: 23       Pertinent Vitals/Pain Pain Assessment Pain Assessment: No/denies pain    Home Living Family/patient expects to be discharged to:: Private residence Living Arrangements: Spouse/significant other Available Help at Discharge: Family;Available 24 hours/day Type of Home: House Home Access: Stairs to enter Entrance Stairs-Rails: None Entrance Stairs-Number of Steps: 1 Alternate Level Stairs-Number of Steps: 8 + 8 Home Layout: Two level;Bed/bath upstairs Home Equipment: Hand held shower head      Prior Function Prior Level of Function : Independent/Modified Independent;Driving             Mobility Comments: No AD. ADLs Comments: Delivers plumbing supplies.     Hand Dominance   Dominant Hand: Right    Extremity/Trunk Assessment   Upper Extremity Assessment Upper Extremity Assessment: Overall WFL for tasks assessed;Defer to OT evaluation (MMT scores of 5 grossly bil, sensation and coordination intact bil)    Lower Extremity Assessment Lower Extremity Assessment: Overall WFL for tasks assessed (MMT scores of 5 grossly bil, sensation and coordination intact bil)    Cervical / Trunk Assessment Cervical / Trunk Assessment: Normal  Communication   Communication: No difficulties  Cognition Arousal/Alertness: Awake/alert Behavior During Therapy: Flat affect Overall Cognitive Status: Impaired/Different from baseline Area of Impairment: Memory, Problem solving                     Memory: Decreased short-term memory       Problem Solving: Slow processing General Comments: Pt's wife reporting memory deficits beginning around May. Pt slow to process and respond to cues/questions this session, maintaining a flat affect        General Comments General comments (skin integrity, edema, etc.): educated pt and wife on "BE FAST"    Exercises     Assessment/Plan    PT Assessment  Patient does not need any further PT services  PT Problem List         PT Treatment Interventions      PT Goals (Current goals can be found in the Care Plan section)  Acute Rehab PT Goals Patient Stated Goal: to return to baseline PT Goal Formulation: All assessment and education complete, DC therapy Time For Goal Achievement: 04/13/23 Potential to Achieve Goals: Good    Frequency       Co-evaluation               AM-PAC PT "6 Clicks" Mobility  Outcome Measure Help needed turning from your back to your side while in a flat bed without using bedrails?: None Help needed moving from lying on your back to sitting on the side of a flat bed without using bedrails?: None Help needed moving to and from a bed to a chair (including a wheelchair)?: None Help needed standing up from a chair using your arms (e.g., wheelchair or bedside chair)?: None Help needed to walk in hospital room?: None Help needed climbing 3-5 steps with a railing? : A Little 6 Click Score: 23    End of Session Equipment Utilized During Treatment: Gait belt Activity Tolerance: Patient tolerated treatment well Patient left: in bed;with call bell/phone within reach;with family/visitor present   PT Visit Diagnosis: Other symptoms and signs involving the nervous system (A54.098)    Time: 1191-4782 PT Time  Calculation (min) (ACUTE ONLY): 31 min   Charges:   PT Evaluation $PT Eval Low Complexity: 1 Low PT Treatments $Therapeutic Activity: 8-22 mins        Raymond Gurney, PT, DPT Acute Rehabilitation Services  Office: [phone]   Jewel Baize 04/12/2023, 9:54 AM

## 2023-04-12 NOTE — Progress Notes (Signed)
Discharge instructions given. Patient verbalized understanding and all questions were answered.  ?

## 2023-04-12 NOTE — Discharge Summary (Signed)
Physician Discharge Summary   Patient: Patrick Munoz MRN: [id number] DOB: October 09, 1986  Admit date:     04/11/2023  Discharge date: 04/12/23  Discharge Physician: Thad Ranger, MD    PCP: Collene Mares, [place]   Recommendations at discharge:    Asprin and plavix for 21 days, then plavix alone  Crestor 40mg  daily Started Novolin 70/30 22units BID   Discharge Diagnoses:    TIA (transient ischemic attack)   Type 2 diabetes mellitus (HCC), uncontrolled with hyperglycemia    HTN (hypertension)   Pancolitis (HCC)   Obesity, Class III, BMI 40-49.9 (morbid obesity) (HCC)   Hyperlipidemia    Hospital Course:  Patient is a 37 year old male with hypertension, ADHD, diabetes mellitus, hyperlipidemia presented after having 2 episodes of left-sided numbness and weakness.  Patient also reported some mild intermittent headache, no change in vision, nausea or vomiting.  No prior history of migraine headaches.    CTA negative for LVO, CT head negative for acute abnormality. Patient was admitted for stroke workup   Assessment and Plan: TIA (transient ischemic attack) -Presented with 2 episodes of left-sided numbness and weakness, headache, improved -CT head negative for acute CVA, CTA with no emergent large vessel occlusion or proximal hemodynamically significant stenosis -MRI brain negative for acute CVA -Patient was placed on aspirin, Plavix, so far no acute issues. Neurology recommended ASA+ plavix for 21 days, then plavix alone   -Follow 2D echo showed EF 60-65% -Lipid panel showed cholesterol 127, HDL 24, triglycerides 531, LDL unable to calculate -Per patient, he has not tolerated atorvastatin in the past, placed on Crestor 40 mg daily -Hemoglobin A1c 10.5      Type 2 diabetes mellitus (HCC), uncontrolled with hyperglycemia, IDDM with complication of TIA -Hemoglobin A1c 10.5 -CBGs uncontrolled, diabetic coordinator consult -Placed on Novolin 70/30 22 units BID        HTN  (hypertension) -Resume lisinopril hydrochlorothiazide     History of pancolitis (HCC) -Unclear if patient has ulcerative colitis. Patient was admitted in 2021, underwent colonoscopy and biopsy had shown increased eosinophils in the epithelium and in the lamina propria, no features of chronicity, nonspecific and differentials include eosinophilic colitis, collagen vascular disease, drug reaction, parasitosis and idiopathic eosinophilic syndrome. He did not follow-up with Dr. Loreta Ave who did the inpatient colonoscopy, followed with [facility] Celso Amy, [place], last seen her in August 2022.  Had a "flare" in May 2022 which was treated with Cipro and Flagyl, budesonide (insurance did not cover) and was put on mesalamine, currently not on the [facility].  She had mentioned in her note as suspected ulcerative colitis, CT abdomen in 10/2021 was normal without any abdominal pathology. -follow-up out patient with GI, [facility]          Hyperlipidemia -Lipid panel showed cholesterol 127, HDL 24, triglycerides 531, LDL unable to calculate -Per patient, he has not tolerated atorvastatin in the past, placed on Crestor 40 mg daily   Obesity Estimated body mass index is 36.97 kg/m as calculated from the following:   Height as of this encounter: 6\' 2"  (1.88 m).   Weight as of this encounter: 130.6 kg.      Pain control - [employer] Controlled Substance Reporting System database was reviewed. and patient was instructed, not to drive, operate heavy machinery, perform activities at heights, swimming or participation in water activities or provide baby-sitting services while on Pain, Sleep and Anxiety Medications; until their outpatient Physician has advised to do so again. Also recommended to not to  take more than prescribed Pain, Sleep and Anxiety Medications.  Consultants: neurology  Procedures performed:   Disposition: Home Diet recommendation:  Discharge Diet Orders (From admission, onward)     Start     Ordered    04/12/23 0000  Diet Carb Modified        04/12/23 1656            DISCHARGE MEDICATION: Allergies as of 04/12/2023       Reactions   Asa [aspirin] Other (See Comments)   Told to avoid aspirin due to UC   Lipitor [atorvastatin] Other (See Comments)   Myalgias Leg weakness   Metformin And Related Diarrhea   Penicillins Other (See Comments)   Childhood allergy Unknown reaction        Medication List     STOP taking these medications    atorvastatin 40 MG tablet Commonly known as: LIPITOR   lisinopril 10 MG tablet Commonly known as: ZESTRIL   NovoLIN R ReliOn 100 units/mL injection Generic drug: insulin regular       TAKE these medications    acetaminophen 500 MG tablet Commonly known as: TYLENOL Take 1,000 mg by mouth daily as needed for moderate pain, fever or headache.   aspirin EC 81 MG tablet Take 1 tablet (81 mg total) by mouth daily for 21 days. Swallow whole.   Blood Glucose Monitoring Suppl Devi 1 each by Does not apply route 3 (three) times daily. May dispense any manufacturer covered by patient's insurance.   BLOOD GLUCOSE TEST STRIPS Strp 1 each by Does not apply route 3 (three) times daily. Use as directed to check blood sugar. May dispense any manufacturer covered by patient's insurance and fits patient's device.   clopidogrel 75 MG tablet Commonly known as: Plavix Take 1 tablet (75 mg total) by mouth daily.   gabapentin 400 MG capsule Commonly known as: NEURONTIN Take 400 mg by mouth daily as needed (neuropathy, leg pain).   Insulin Syringe-Needle U-100 25G X 5/8" 1 ML Misc 1 each by Does not apply route 3 (three) times daily. May dispense any manufacturer covered by patient's insurance.   Lancet Device Misc 1 each by Does not apply route 3 (three) times daily. May dispense any manufacturer covered by patient's insurance.   Lancets Misc 1 each by Does not apply route 3 (three) times daily. Use as directed to check blood sugar. May  dispense any manufacturer covered by patient's insurance and fits patient's device.   lisinopril-hydrochlorothiazide 10-12.5 MG tablet Commonly known as: ZESTORETIC Take 1 tablet by mouth daily.   NovoLIN 70/30 (70-30) 100 UNIT/ML injection Generic drug: insulin NPH-regular Human Inject 22 Units into the skin 2 (two) times daily with a meal.   pantoprazole 40 MG tablet Commonly known as: PROTONIX Take 1 tablet (40 mg total) by mouth daily.   rosuvastatin 40 MG tablet Commonly known as: CRESTOR Take 1 tablet (40 mg total) by mouth at bedtime.   triamcinolone cream 0.1 % Commonly known as: KENALOG Apply 1 Application topically daily as needed (irritation).        Follow-up Information     [place], [place], [place]. Schedule an appointment as soon as possible for a visit in 2 week(s).   Specialty: Internal Medicine Why: for hospital follow-up Contact information: [address] [place] [address] [phone]         Micki Riley, MD. Schedule an appointment as soon as possible for a visit in 4 week(s).  Specialties: Neurology, Radiology Why: for hospital follow-up Contact information: [address] [place] [address] [phone]                Discharge Exam: Ceasar Mons Weights   04/11/23 1439 04/11/23 2013  Weight: 130.6 kg 130.6 kg   No acute complaints, feels close to baseline   BP (!) 151/91 (BP Location: Right Arm)   Pulse 78   Temp 98.3 F (36.8 C) (Oral)   Resp 18   Ht 6\' 2"  (1.88 m)   Wt 130.6 kg   SpO2 99%   BMI 36.97 kg/m   Physical Exam General: Alert and oriented x 3, NAD Cardiovascular: S1 S2 clear, RRR.  Respiratory: CTAB, no wheezing, rales or rhonchi Gastrointestinal: Soft, nontender, nondistended, NBS Ext: no pedal edema bilaterally Neuro: no new deficits Psych: Normal affect and demeanor, alert and oriented x3    Condition at discharge: fair  The results of significant diagnostics from  this hospitalization (including imaging, microbiology, ancillary and laboratory) are listed below for reference.   Imaging Studies: ECHOCARDIOGRAM COMPLETE  Result Date: 04/12/2023    ECHOCARDIOGRAM REPORT   Patient Name:   JENSON BURNARD Date of Exam: 04/12/2023 Medical Rec #:  [id number]           Height:       74.0 in Accession #:    [id number]          Weight:       287.9 lb Date of Birth:  04-Feb-1986           BSA:          2.538 m Patient Age:    37 years            BP:           152/102 mmHg Patient Gender: M                   HR:           71 bpm. Exam Location:  Inpatient Procedure: 2D Echo, 3D Echo, Cardiac Doppler and Color Doppler Indications:    TIA  History:        Patient has prior history of Echocardiogram examinations, most                 recent 11/30/2021. TIA; Risk Factors:Hypertension, Diabetes and                 Dyslipidemia.  Sonographer:    Sheralyn Boatman RDCS Referring Phys: [id number] Erendira Crabtree K Nilson Tabora  Sonographer Comments: Patient is obese. Image acquisition challenging due to patient body habitus. IMPRESSIONS  1. Left ventricular ejection fraction, by estimation, is 60 to 65%. The left ventricle has normal function. The left ventricle has no regional wall motion abnormalities. There is mild left ventricular hypertrophy. Left ventricular diastolic parameters were normal.  2. Right ventricular systolic function is normal. The right ventricular size is normal. Tricuspid regurgitation signal is inadequate for assessing PA pressure.  3. The mitral valve is normal in structure. No evidence of mitral valve regurgitation.  4. Aortic valve regurgitation is not visualized.  5. The inferior vena cava is normal in size with greater than 50% respiratory variability, suggesting right atrial pressure of 3 mmHg. FINDINGS  Left Ventricle: Left ventricular ejection fraction, by estimation, is 60 to 65%. The left ventricle has normal function. The left ventricle has no regional wall motion abnormalities. The left  ventricular internal cavity size was normal in size.  There is  mild left ventricular hypertrophy. Left ventricular diastolic parameters were normal. Right Ventricle: The right ventricular size is normal. Right ventricular systolic function is normal. Tricuspid regurgitation signal is inadequate for assessing PA pressure. Left Atrium: Left atrial size was normal in size. Right Atrium: Right atrial size was normal in size. Pericardium: There is no evidence of pericardial effusion. Mitral Valve: The mitral valve is normal in structure. No evidence of mitral valve regurgitation. Tricuspid Valve: Tricuspid valve regurgitation is not demonstrated. Aortic Valve: Aortic valve regurgitation is not visualized. Pulmonic Valve: Pulmonic valve regurgitation is not visualized. Aorta: The aortic root and ascending aorta are structurally normal, with no evidence of dilitation. Venous: The inferior vena cava is normal in size with greater than 50% respiratory variability, suggesting right atrial pressure of 3 mmHg. IAS/Shunts: The interatrial septum was not well visualized.  LEFT VENTRICLE PLAX 2D LVIDd:         4.50 cm      Diastology LVIDs:         2.90 cm      LV e' medial:    10.80 cm/s LV PW:         1.10 cm      LV E/e' medial:  7.9 LV IVS:        1.30 cm      LV e' lateral:   13.40 cm/s LVOT diam:     2.50 cm      LV E/e' lateral: 6.3 LV SV:         120 LV SV Index:   47 LVOT Area:     4.91 cm                              3D Volume EF: LV Volumes (MOD)            3D EF:        53 % LV vol d, MOD A2C: 129.0 ml LV EDV:       213 ml LV vol d, MOD A4C: 131.0 ml LV ESV:       100 ml LV vol s, MOD A2C: 61.6 ml  LV SV:        113 ml LV vol s, MOD A4C: 62.7 ml LV SV MOD A2C:     67.4 ml LV SV MOD A4C:     131.0 ml LV SV MOD BP:      68.2 ml RIGHT VENTRICLE             IVC RV S prime:     14.30 cm/s  IVC diam: 1.95 cm TAPSE (M-mode): 2.5 cm LEFT ATRIUM             Index        RIGHT ATRIUM           Index LA diam:        3.80 cm 1.50  cm/m   RA Area:     12.10 cm LA Vol (A2C):   32.6 ml 12.83 ml/m  RA Volume:   26.80 ml  10.56 ml/m LA Vol (A4C):   43.8 ml 17.26 ml/m LA Biplane Vol: 36.1 ml 14.23 ml/m  AORTIC VALVE LVOT Vmax:   125.00 cm/s LVOT Vmean:  82.100 cm/s LVOT VTI:    0.244 m  AORTA Ao Root diam: 3.40 cm Ao Asc diam:  3.40 cm MITRAL VALVE MV Area (PHT): 3.53 cm    SHUNTS MV  Decel Time: 215 msec    Systemic VTI:  0.24 m MV E velocity: 84.90 cm/s  Systemic Diam: 2.50 cm MV A velocity: 84.90 cm/s MV E/A ratio:  1.00 Photographer signed by Carolan Clines Signature Date/Time: 04/12/2023/4:00:26 PM    Final    MR BRAIN WO CONTRAST  Result Date: 04/11/2023 CLINICAL DATA:  Stroke, follow up EXAM: MRI HEAD WITHOUT CONTRAST TECHNIQUE: Multiplanar, multiecho pulse sequences of the brain and surrounding structures were obtained without intravenous contrast. COMPARISON:  Same day CT head FINDINGS: Brain: Negative for an acute infarct. No hemorrhage. No extra-axial fluid collection. No hydrocephalus. Mega cisterna magna. No mass lesion. No mass effect. Vascular: Normal flow voids. Skull and upper cervical spine: Normal marrow signal. Sinuses/Orbits: No middle ear or mastoid effusion. Paranasal sinuses are clear. Orbits are unremarkable. Other: None. IMPRESSION: No acute intracranial process. Electronically Signed   By: Lorenza Cambridge M.D.   On: 04/11/2023 18:09   CT ANGIO HEAD NECK W WO CM (CODE STROKE)  Result Date: 04/11/2023 CLINICAL DATA:  Neuro deficit, acute, stroke suspected EXAM: CT ANGIOGRAPHY HEAD AND NECK WITH AND WITHOUT CONTRAST TECHNIQUE: Multidetector CT imaging of the head and neck was performed using the standard protocol during bolus administration of intravenous contrast. Multiplanar CT image reconstructions and MIPs were obtained to evaluate the vascular anatomy. Carotid stenosis measurements (when applicable) are obtained utilizing NASCET criteria, using the distal internal carotid diameter as the  denominator. RADIATION DOSE REDUCTION: This exam was performed according to the departmental dose-optimization program which includes automated exposure control, adjustment of the mA and/or kV according to patient size and/or use of iterative reconstruction technique. CONTRAST:  75mL OMNIPAQUE IOHEXOL 350 MG/ML SOLN COMPARISON:  Same day CT head. FINDINGS: CTA NECK FINDINGS Aortic arch: Great vessel origins are patent without significant stenosis. Right carotid system: No evidence of dissection, stenosis (50% or greater), or occlusion. Left carotid system: No evidence of dissection, stenosis (50% or greater), or occlusion. Vertebral arteries: Codominant. No evidence of dissection, stenosis (50% or greater), or occlusion. Skeleton: No acute abnormality on limited assessment. Other neck: No acute abnormality on limited assessment. Upper chest: Visualized lung apices are clear. Review of the MIP images confirms the above findings CTA HEAD FINDINGS Anterior circulation: Bilateral intracranial ICAs, MCAs, and ACAs are patent without proximal hemodynamically significant stenosis. No aneurysm identified. Posterior circulation:  Bilateral intradural vertebral arteries, Venous sinuses: As permitted by contrast timing, patent. Review of the MIP images confirms the above findings IMPRESSION: No emergent large vessel occlusion or proximal hemodynamically significant stenosis. Electronically Signed   By: Feliberto Harts M.D.   On: 04/11/2023 15:13   CT HEAD CODE STROKE WO CONTRAST  Result Date: 04/11/2023 CLINICAL DATA:  Code stroke.  Neuro deficit, acute, stroke suspected EXAM: CT HEAD WITHOUT CONTRAST TECHNIQUE: Contiguous axial images were obtained from the base of the skull through the vertex without intravenous contrast. RADIATION DOSE REDUCTION: This exam was performed according to the departmental dose-optimization program which includes automated exposure control, adjustment of the mA and/or kV according to patient  size and/or use of iterative reconstruction technique. COMPARISON:  None Available. FINDINGS: Brain: No evidence of acute infarction, hemorrhage, hydrocephalus, extra-axial collection or mass lesion/mass effect. Vascular: No hyperdense vessel. Skull: No acute fracture. Sinuses/Orbits: Clear sinuses.  No acute orbital findings. Total score (0-10 with 10 being normal): 10. IMPRESSION: 1. No evidence of acute intracranial abnormality. 2. ASPECTS is 10. Code stroke imaging results were communicated on 04/11/2023 at 3:06 pm to provider  Bhagat via secure text paging. Electronically Signed   By: Feliberto Harts M.D.   On: 04/11/2023 15:07    Microbiology: Results for orders placed or performed during the hospital encounter of 08/28/22  Group A Strep by PCR     Status: Abnormal   Collection Time: 08/28/22  7:01 PM   Specimen: Throat; Sterile Swab  Result Value Ref Range Status   Group A Strep by PCR DETECTED (A) NOT DETECTED Final    Comment: Performed at [facility], [address] [place], [place] [address]    Labs: CBC: Recent Labs  Lab 04/11/23 1443 04/11/23 1453 04/11/23 2106  WBC 10.1  --  9.7  NEUTROABS 5.2  --   --   HGB 16.4 16.0 15.8  HCT 46.4 47.0 43.7  MCV 82.6  --  83.7  PLT 246  --  238   Basic Metabolic Panel: Recent Labs  Lab 04/11/23 1443 04/11/23 1453 04/11/23 2106  NA 134* 136  --   K 4.0 4.0  --   CL 101 103  --   CO2 22  --   --   GLUCOSE 322* 335*  --   BUN 11 13  --   CREATININE 0.69 0.50* 0.90  CALCIUM 9.2  --   --    Liver Function Tests: Recent Labs  Lab 04/11/23 1443  AST 24  ALT 32  ALKPHOS 105  BILITOT 0.7  PROT 7.0  ALBUMIN 3.7   CBG: Recent Labs  Lab 04/11/23 2207 04/12/23 0614 04/12/23 0723 04/12/23 1114 04/12/23 1615  GLUCAP 356* 271* 261* 250* 226*    Discharge time spent: greater than 30 minutes.  Signed: Thad Ranger, MD [facility] 04/12/2023

## 2023-05-08 IMAGING — CT CT ABD-PELV W/ CM
2 of 4 series · 16 of 46 positions shown, 18 images · IV contrast (APPLIED)
Comparison: 01/03/2020

CLINICAL DATA: Worsening abdominal pain for 1 month. Hernia.
Inflammatory bowel disease.

EXAM:
CT ABDOMEN AND PELVIS WITH CONTRAST
TECHNIQUE: Multidetector CT imaging of the abdomen and pelvis was performed
using the standard protocol following bolus administration of
intravenous contrast.
CONTRAST:  100mL OMNIPAQUE IOHEXOL 300 MG/ML  SOLN

[Series 2: abd pel w · axial · 0.90mm/px · z∈[+618,+1083]mm · 13 of 101 slices shown, 15 images]
[im 4/101  soft-tissue]
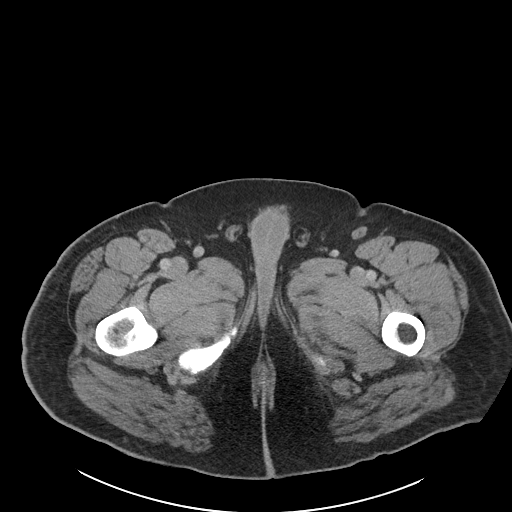
[im 4/101  bone]
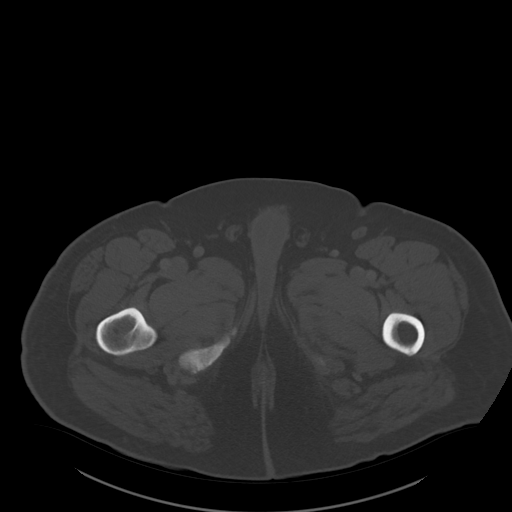
[im 12/101  soft-tissue]
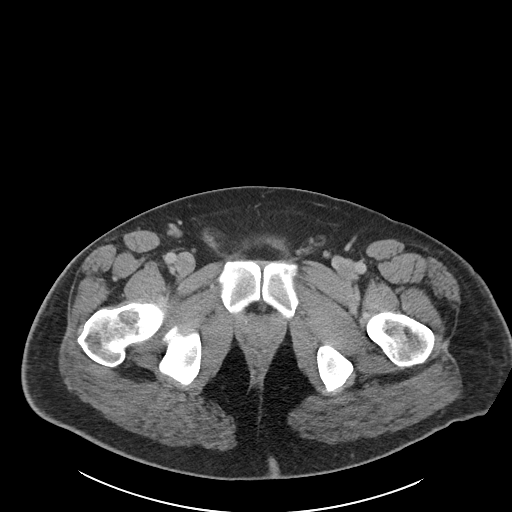
[im 20/101  soft-tissue]
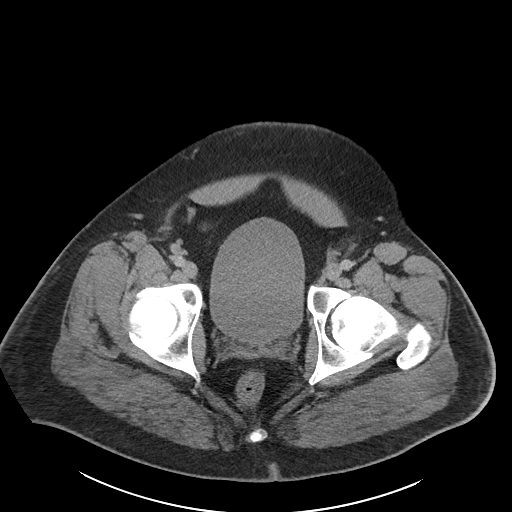
[im 27/101  soft-tissue]
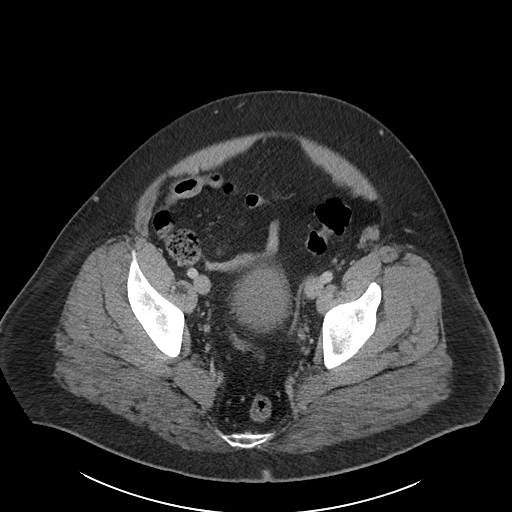
[im 35/101  soft-tissue]
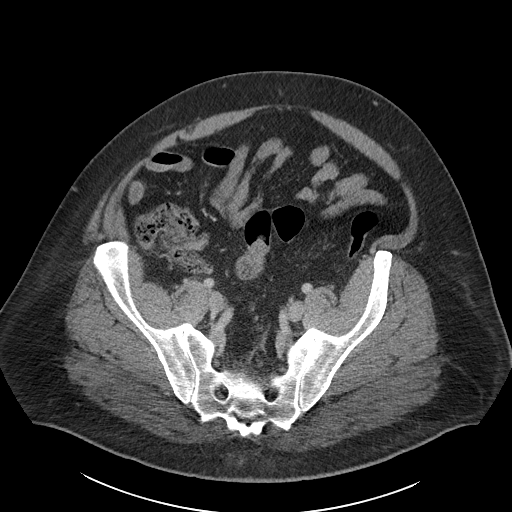
[im 43/101  soft-tissue]
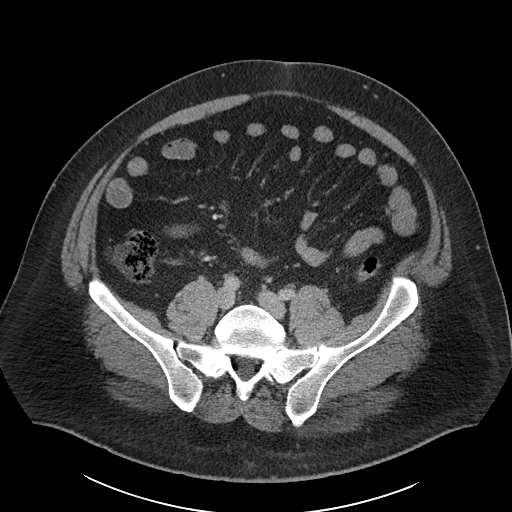
[im 51/101  soft-tissue]
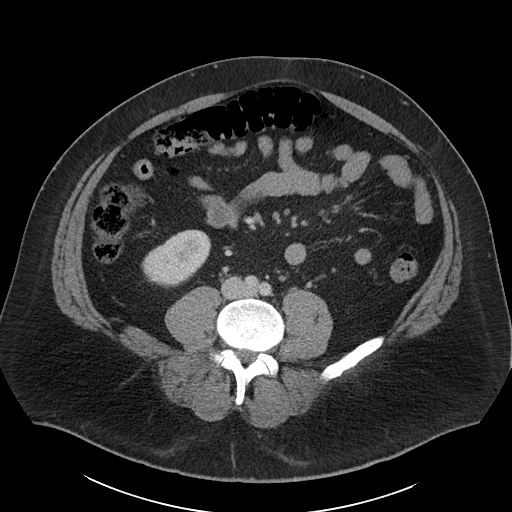
[im 58/101  soft-tissue]
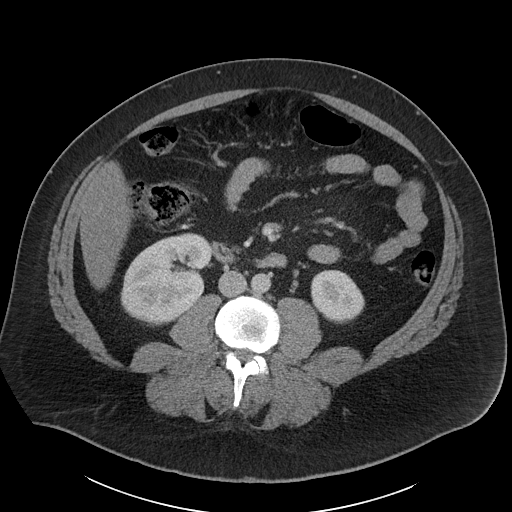
[im 66/101  soft-tissue]
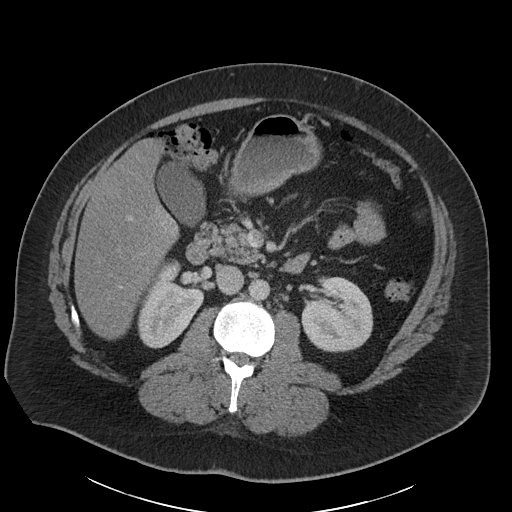
[im 66/101  bone]
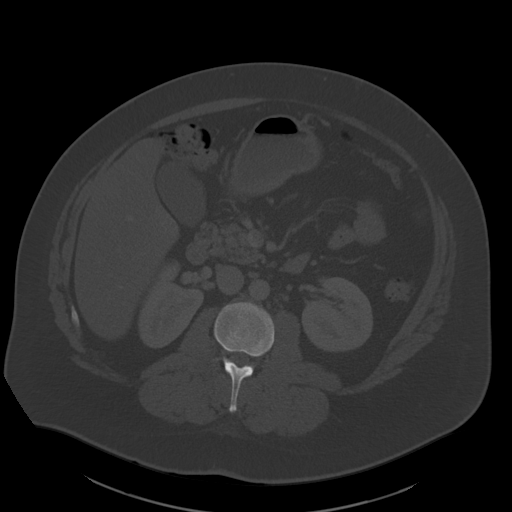
[im 74/101  soft-tissue]
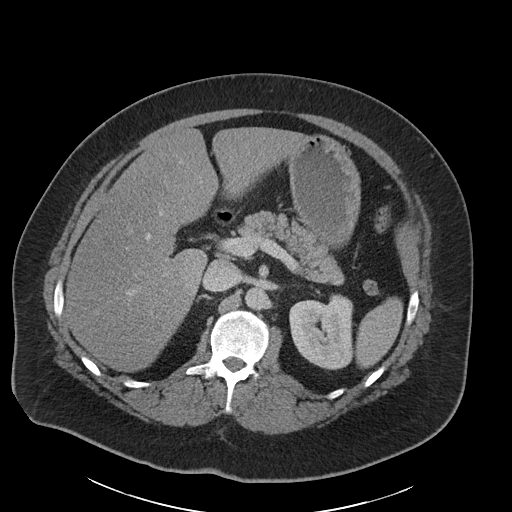
[im 81/101  soft-tissue]
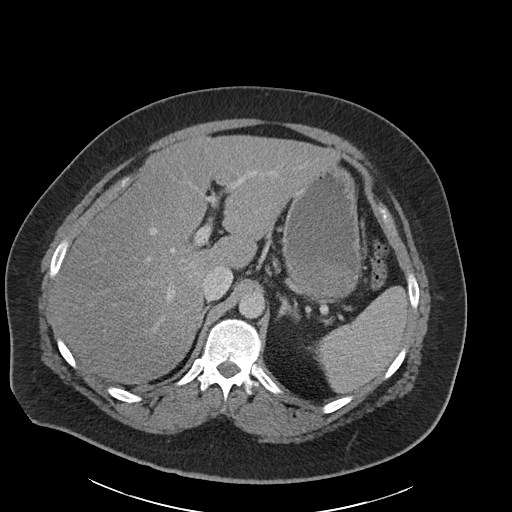
[im 89/101  soft-tissue]
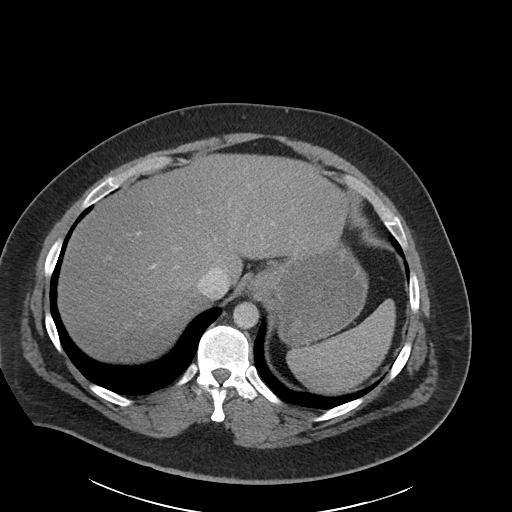
[im 97/101  soft-tissue]
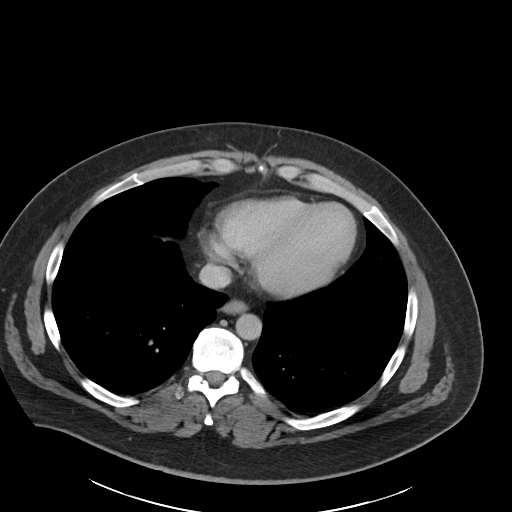

[Series 5: coronal · coronal · 0.93mm/px · 3 of 124 slices shown]
[im 42/124  soft-tissue]
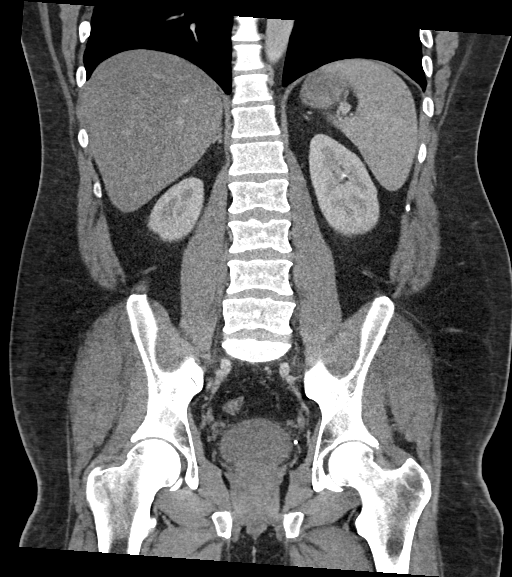
[im 55/124  soft-tissue]
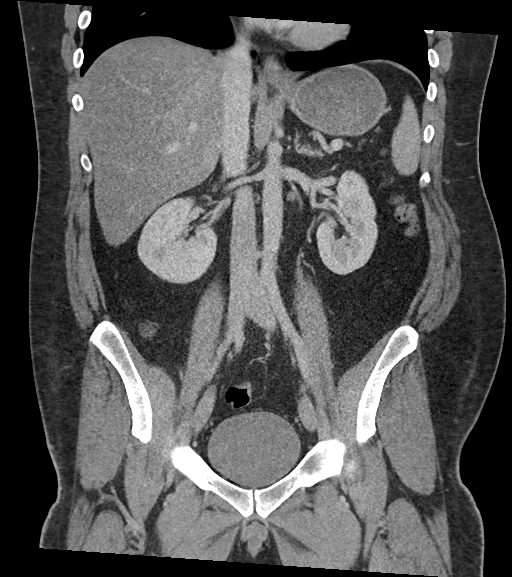
[im 69/124  soft-tissue]
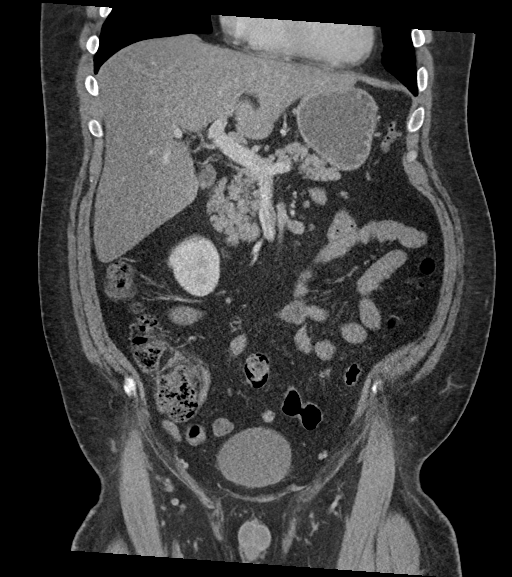

[16 of 46 positions shown; findings below may reference images not displayed]

FINDINGS: Lower chest: Lung bases are clear.

Hepatobiliary: Diffuse fatty infiltration of the liver. No focal
lesions. Tiny stones suggested in the gallbladder neck. No
gallbladder wall thickening or edema. No bile duct dilatation.

Pancreas: Unremarkable. No pancreatic ductal dilatation or
surrounding inflammatory changes.

Spleen: Normal in size without focal abnormality.

Adrenals/Urinary Tract: Adrenal glands are unremarkable. Kidneys are
normal, without renal calculi, focal lesion, or hydronephrosis.
Bladder is unremarkable.

Stomach/Bowel: Stomach, small bowel, and colon are not abnormally
distended. No small or large bowel distention. No wall thickening or
inflammatory changes appreciated. Appendix is not identified.

Vascular/Lymphatic: No significant vascular findings are present. No
enlarged abdominal or pelvic lymph nodes.

Reproductive: Prostate is unremarkable.

Other: No free air or free fluid in the abdomen. Small periumbilical
hernia containing fat.

Musculoskeletal: No acute or significant osseous findings.
IMPRESSION: 1. No acute process demonstrated in the abdomen or pelvis. No
evidence of bowel obstruction or inflammation.
2. Fatty infiltration of the liver.
3. Cholelithiasis.
4. Small periumbilical hernia containing fat.

## 2023-05-29 ENCOUNTER — Encounter: Payer: Self-pay | Admitting: Neurology

## 2023-05-29 ENCOUNTER — Ambulatory Visit: Payer: BC Managed Care – PPO | Admitting: Neurology

## 2023-05-29 VITALS — BP 133/83 | HR 73 | Resp 14 | Ht 74.0 in

## 2023-05-29 DIAGNOSIS — G459 Transient cerebral ischemic attack, unspecified: Secondary | ICD-10-CM

## 2023-05-29 NOTE — Patient Instructions (Signed)
Make sure PFO (transcranial doppler with bubble study) Make sure you don't have afib (30-day heart monitor and consider a loop recorder) Had sleep apnea test I will make these recs to pcp Evangeline Dakin, PA   Stroke Prevention Some medical conditions and behaviors can lead to a higher chance of having a stroke. You can help prevent a stroke by eating healthy, exercising, not smoking, and managing any medical conditions you have. Stroke is a leading cause of functional impairment. Primary prevention is particularly important because a majority of strokes are first-time events. Stroke changes the lives of not only those who experience a stroke but also their family and other caregivers. How can this condition affect me? A stroke is a medical emergency and should be treated right away. A stroke can lead to brain damage and can sometimes be life-threatening. If a person gets medical treatment right away, there is a better chance of surviving and recovering from a stroke. What can increase my risk? The following medical conditions may increase your risk of a stroke: Cardiovascular disease. High blood pressure (hypertension). Diabetes. High cholesterol. Sickle cell disease. Blood clotting disorders (hypercoagulable state). Obesity. Sleep disorders (obstructive sleep apnea). Other risk factors include: Being older than age 1. Having a history of blood clots, stroke, or mini-stroke (transient ischemic attack, TIA). Genetic factors, such as race, ethnicity, or a family history of stroke. Smoking cigarettes or using other tobacco products. Taking birth control pills, especially if you also use tobacco. Heavy use of alcohol or drugs, especially cocaine and methamphetamine. Physical inactivity. What actions can I take to prevent this? Manage your health conditions High cholesterol levels. Eating a healthy diet is important for preventing high cholesterol. If cholesterol cannot be managed  through diet alone, you may need to take medicines. Take any prescribed medicines to control your cholesterol as told by your health care provider. Hypertension. To reduce your risk of stroke, try to keep your blood pressure below 130/80. Eating a healthy diet and exercising regularly are important for controlling blood pressure. If these steps are not enough to manage your blood pressure, you may need to take medicines. Take any prescribed medicines to control hypertension as told by your health care provider. Ask your health care provider if you should monitor your blood pressure at home. Have your blood pressure checked every year, even if your blood pressure is normal. Blood pressure increases with age and some medical conditions. Diabetes. Eating a healthy diet and exercising regularly are important parts of managing your blood sugar (glucose). If your blood sugar cannot be managed through diet and exercise, you may need to take medicines. Take any prescribed medicines to control your diabetes as told by your health care provider. Get evaluated for obstructive sleep apnea. Talk to your health care provider about getting a sleep evaluation if you snore a lot or have excessive sleepiness. Make sure that any other medical conditions you have, such as atrial fibrillation or atherosclerosis, are managed. Nutrition Follow instructions from your health care provider about what to eat or drink to help manage your health condition. These instructions may include: Reducing your daily calorie intake. Limiting how much salt (sodium) you use to 1,500 milligrams (mg) each day. Using only healthy fats for cooking, such as olive oil, canola oil, or sunflower oil. Eating healthy foods. You can do this by: Choosing foods that are high in fiber, such as whole grains, and fresh fruits and vegetables. Eating at least 5 servings of fruits and vegetables a  day. Try to fill one-half of your plate with fruits and  vegetables at each meal. Choosing lean protein foods, such as lean cuts of meat, poultry without skin, fish, tofu, beans, and nuts. Eating low-fat dairy products. Avoiding foods that are high in sodium. This can help lower blood pressure. Avoiding foods that have saturated fat, trans fat, and cholesterol. This can help prevent high cholesterol. Avoiding processed and prepared foods. Counting your daily carbohydrate intake.  Lifestyle If you drink alcohol: Limit how much you have to: 0-1 drink a day for women who are not pregnant. 0-2 drinks a day for men. Know how much alcohol is in your drink. In the [place] one drink equals one 12 oz bottle of beer ( ), one 5 oz glass of wine ( ), or one 1 oz glass of hard liquor (44mL). Do not use any products that contain nicotine or tobacco. These products include cigarettes, chewing tobacco, and vaping devices, such as e-cigarettes. If you need help quitting, ask your health care provider. Avoid secondhand smoke. Do not use drugs. Activity  Try to stay at a healthy weight. Get at least 30 minutes of exercise on most days, such as: Fast walking. Biking. Swimming. Medicines Take over-the-counter and prescription medicines only as told by your health care provider. Aspirin or blood thinners (antiplatelets or anticoagulants) may be recommended to reduce your risk of forming blood clots that can lead to stroke. Avoid taking birth control pills. Talk to your health care provider about the risks of taking birth control pills if: You are over 66 years old. You smoke. You get very bad headaches. You have had a blood clot. Where to find more information American Stroke Association: www.strokeassociation.org Get help right away if: You or a loved one has any symptoms of a stroke. "BE FAST" is an easy way to remember the main warning signs of a stroke: B - Balance. Signs are dizziness, sudden trouble walking, or loss of balance. E - Eyes. Signs  are trouble seeing or a sudden change in vision. F - Face. Signs are sudden weakness or numbness of the face, or the face or eyelid drooping on one side. A - Arms. Signs are weakness or numbness in an arm. This happens suddenly and usually on one side of the body. S - Speech. Signs are sudden trouble speaking, slurred speech, or trouble understanding what people say. T - Time. Time to call emergency services. Write down what time symptoms started. You or a loved one has other signs of a stroke, such as: A sudden, severe headache with no known cause. Nausea or vomiting. Seizure. These symptoms may represent a serious problem that is an emergency. Do not wait to see if the symptoms will go away. Get medical help right away. Call your local emergency services (911 in the [place] Do not drive yourself to the hospital. Summary You can help to prevent a stroke by eating healthy, exercising, not smoking, limiting alcohol intake, and managing any medical conditions you may have. Do not use any products that contain nicotine or tobacco. These include cigarettes, chewing tobacco, and vaping devices, such as e-cigarettes. If you need help quitting, ask your health care provider. Remember "BE FAST" for warning signs of a stroke. Get help right away if you or a loved one has any of these signs. This information is not intended to replace advice given to you by your health care provider. Make sure you discuss any questions you have with your health care provider. Document  Revised: 09/11/2022 Document Reviewed: 09/11/2022 Elsevier Patient Education  2024 [employer] Atrial Fibrillation Atrial fibrillation (AFib) is a type of irregular or rapid heartbeat (arrhythmia). In AFib, the top part of the heart (atria) beats in an irregular pattern. This makes the heart unable to pump blood normally and effectively. The goal of treatment is to prevent blood clots from forming, control your heart rate, or restore your  heartbeat to a normal rhythm. If this condition is not treated, it can cause serious problems, such as a weakened heart muscle (cardiomyopathy) or a stroke. What are the causes? This condition is often caused by medical conditions that damage the heart's electrical system. These include: High blood pressure (hypertension). This is the most common cause. Certain heart problems or conditions, such as heart failure, coronary artery disease, heart valve problems, or heart surgery. Diabetes. Overactive thyroid (hyperthyroidism). Chronic kidney disease. Certain lung conditions, such as emphysema, pneumonia, or COPD. Obstructive sleep apnea. In some cases, the cause of this condition is not known. What increases the risk? This condition is more likely to develop in: Older adults. Athletes who do endurance exercise. People who have a family history of AFib. Males. People who are Caucasian. People who are obese. People who smoke or misuse alcohol. What are the signs or symptoms? Symptoms of this condition include: Fast or irregular heartbeats (palpitations). Discomfort or pain in your chest. Shortness of breath. Sudden light-headedness or weakness. Tiring easily during exercise or activity. Syncope (fainting). Sweating. In some cases, there are no symptoms. How is this diagnosed? Your health care provider may detect AFib when taking your pulse. If detected, this condition may be diagnosed with: An electrocardiogram (ECG) to check electrical signals of the heart. An ambulatory cardiac monitor to record your heart's activity for a few days. A transthoracic echocardiogram (TTE) to create pictures of your heart. A transesophageal echocardiogram (TEE) to create even clearer pictures of your heart. A stress test to check your blood supply while you exercise. Imaging tests, such as a CT scan or chest X-ray. Blood tests. How is this treated? Treatment depends on underlying conditions and how  you feel when you get AFib. This condition may be treated with: Medicines to prevent blood clots or to treat heart rate or heart rhythm problems. Electrical cardioversion to reset the heart's rhythm. A pacemaker to correct abnormal heart rhythm. Ablation to remove the heart tissue that sends abnormal signals. Left atrial appendage closure to seal the area where blood clots can form. In some cases, underlying conditions will be treated. Follow these instructions at home: Medicines Take over-the counter and prescription medicines only as told by your provider. Do not take any new medicines without talking to your provider. If you are taking blood thinners: Talk with your provider before taking aspirin or NSAIDs. These medicines can raise your risk of bleeding. Take your medicines as told. Take them at the same time each day. Do not do things that could hurt or bruise you. Be careful to avoid falls. Wear an alert bracelet or carry a card that says that you take blood thinners. Lifestyle Do not use any products that contain nicotine or tobacco. These products include cigarettes, chewing tobacco, and vaping devices, such as e-cigarettes. If you need help quitting, ask your provider. Eat heart-healthy foods. Talk with a food expert (dietitian) to make an eating plan that is right for you. Exercise regularly as told by your provider. Do not drink alcohol. Lose weight if you are overweight. General instructions If  you have obstructive sleep apnea, manage your condition as told by your provider. Do not use diet pills unless your provider approves. Diet pills can make heart problems worse. Keep all follow-up visits. Your provider will want to check your heart rate and rhythm regularly. Contact a health care provider if: You notice a change in the rate, rhythm, or strength of your heartbeat. You are taking a blood thinner and you notice more bruising. You tire more easily when you exercise or do  heavy work. You have a sudden change in weight. Get help right away if:  You have chest pain. You have trouble breathing. You have side effects of blood thinners, such as blood in your vomit, poop (stool), or pee (urine), or bleeding that does not stop. You have any symptoms of a stroke. "BE FAST" is an easy way to remember the main warning signs of a stroke: B - Balance. Signs are dizziness, sudden trouble walking, or loss of balance. E - Eyes. Signs are trouble seeing or a sudden change in vision. F - Face. Signs are sudden weakness or numbness of the face, or the face or eyelid drooping on one side. A - Arms. Signs are weakness or numbness in an arm. This happens suddenly and usually on one side of the body. S - Speech.Signs are sudden trouble speaking, slurred speech, or trouble understanding what people say. T - Time. Time to call emergency services. Write down what time symptoms started. Other signs of a stroke, such as: A sudden, severe headache with no known cause. Nausea or vomiting. Seizure. These symptoms may be an emergency. Get help right away. Call 911. Do not wait to see if the symptoms will go away. Do not drive yourself to the hospital. This information is not intended to replace advice given to you by your health care provider. Make sure you discuss any questions you have with your health care provider. Document Revised: 06/28/2022 Document Reviewed: 06/28/2022 Elsevier Patient Education  2024 [employer]    Patent Patrick Munoz, Adult  A foramen ovale is a hole between the upper chambers (right atrium and left atrium) of the heart. Before you are born, it is normal to have this hole in your heart. The hole allows blood to circulate through the body without having to go through the lungs. After your birth, when you are able to breathe, you do not need the foramen ovale and it usually closes. If the hole does not close, it is called a patent foramen ovale (PFO). PFO  is a common condition. Most people do not know they have this hole, and they do not have any health problems caused by it. What are the causes? The cause of this condition is not known. What are the signs or symptoms? In most cases, there are no symptoms of this condition. Possible rare symptoms include: Stroke caused by a blood clot. Migraine headaches. Platypnea-orthodeoxia syndrome. This is a condition in which a person has shortness of breath and decreased oxygen when seated or standing but feels better when lying down. How is this diagnosed? This condition may be diagnosed based on: A physical exam and your medical history. Echocardiogram. This test uses sound waves to produce images of the heart. Transesophageal echocardiogram (TEE). This type of echocardiogram is performed by placing a probe in the part of the body that moves food from the mouth to the stomach (esophagus). Electrocardiogram (ECG). This test identifies changes in the electrical activity of the heart. Cardiac MRI.  This is an imaging technique that is used to visualize the heart, if further images are needed after TEE. How is this treated? Usually, no treatment is needed. If your condition is associated with symptoms or blood clots, you may need: Medicines to prevent blood clots and strokes (anticoagulant or antiplateletmedicines). A surgical procedure to close the hole (transcatheter closure). Follow these instructions at home: Take over-the-counter and prescription medicines only as told by your health care provider. Keep all follow-up visits. This is important. Contact a health care provider if: You have a fever. You have frequent or severe headaches. Get help right away if: Your skin turns blue. You have chest pain or difficulty breathing. You have any symptoms of stroke. "BE FAST" is an easy way to remember the main warning signs of stroke: B - Balance. Signs are dizziness, sudden trouble walking, or loss of  balance. E - Eyes. Signs are trouble seeing or a sudden change in vision. F - Face. Signs are sudden weakness or numbness of the face, or the face or eyelid drooping on one side. A - Arms. Signs are weakness or numbness in an arm. This happens suddenly and usually on one side of the body. S - Speech. Signs are sudden trouble speaking, slurred speech, or trouble understanding what people say. T - Time. Time to call emergency services. Write down what time symptoms started. You have other signs of a stroke, such as: A sudden, severe headache with no known cause. Nausea or vomiting. Seizure. These symptoms may represent a serious problem that is an emergency. Do not wait to see if the symptoms will go away. Get medical help right away. Call your local emergency services (911 in the [place] Do not drive yourself to the hospital. Summary A patent foramen ovale is a hole between the upper chambers (right atrium and left atrium) of your heart. The cause of this condition is not known. You may not know that you have a hole in your heart, and you may not have any health problems from it. Usually, no treatment is needed for this condition unless you have symptoms or blood clots. This information is not intended to replace advice given to you by your health care provider. Make sure you discuss any questions you have with your health care provider. Document Revised: 08/12/2020 Document Reviewed: 08/12/2020 Elsevier Patient Education  2024 [employer].

## 2023-05-29 NOTE — Progress Notes (Signed)
[facility]    Provider:  Dr Lucia Gaskins Requesting Provider: Cathren Harsh, MD Primary Care Provider:  Collene Mares, [place]  CC:  TIA  HPI:  Patrick Munoz is a 37 y.o. male here as requested by Cathren Harsh, MD for TIA. Patient here with wife who provides much information. has Pancolitis (HCC); SIRS (systemic inflammatory response syndrome) (HCC); Polycythemia; Type 2 diabetes mellitus (HCC); HTN (hypertension); Obesity, Class III, BMI 40-49.9 (morbid obesity) (HCC); History of colonic polyps; CVA (cerebral vascular accident) (HCC); Stroke-like symptoms; TIA (transient ischemic attack); and Hyperlipidemia on their problem list.   Reviewed notes, labs and imaging from outside physicians, which showed:  MRI brain 04/11/2023: MRI HEAD WITHOUT CONTRAST   TECHNIQUE: Multiplanar, multiecho pulse sequences of the brain and surrounding structures were obtained without intravenous contrast.   COMPARISON:  Same day CT head   FINDINGS: Brain: Negative for an acute infarct. No hemorrhage. No extra-axial fluid collection. No hydrocephalus. Mega cisterna magna. No mass lesion. No mass effect.   Vascular: Normal flow voids.   Skull and upper cervical spine: Normal marrow signal.   Sinuses/Orbits: No middle ear or mastoid effusion. Paranasal sinuses are clear. Orbits are unremarkable.   Other: None.   IMPRESSION: No acute intracranial process.  CTA Head and Neck    FINDINGS: CTA NECK FINDINGS   Aortic arch: Great vessel origins are patent without significant stenosis.   Right carotid system: No evidence of dissection, stenosis (50% or greater), or occlusion.   Left carotid system: No evidence of dissection, stenosis (50% or greater), or occlusion.   Vertebral arteries: Codominant. No evidence of dissection, stenosis (50% or greater), or occlusion.   Skeleton: No acute abnormality on limited assessment.   Other neck: No acute abnormality on  limited assessment.   Upper chest: Visualized lung apices are clear.   Review of the MIP images confirms the above findings   CTA HEAD FINDINGS   Anterior circulation: Bilateral intracranial ICAs, MCAs, and ACAs are patent without proximal hemodynamically significant stenosis. No aneurysm identified.   Posterior circulation:  Bilateral intradural vertebral arteries,   Venous sinuses: As permitted by contrast timing, patent.   Review of the MIP images confirms the above findings   IMPRESSION: No emergent large vessel occlusion or proximal hemodynamically significant stenosis.     Recent Results (from the past 2160 hour(s))  CBG monitoring, ED     Status: Abnormal   Collection Time: 04/11/23  2:35 PM  Result Value Ref Range   Glucose-Capillary 279 (H) 70 - 99 mg/dL    Comment: Glucose reference range applies only to samples taken after fasting for at least 8 hours.  Protime-INR     Status: None   Collection Time: 04/11/23  2:43 PM  Result Value Ref Range   Prothrombin Time 12.2 11.4 - 15.2 seconds   INR 0.9 0.8 - 1.2    Comment: (NOTE) INR goal varies based on device and disease states. Performed at [facility], [address] [place], [place] [address]   APTT     Status: Abnormal   Collection Time: 04/11/23  2:43 PM  Result Value Ref Range   aPTT 21 (L) 24 - 36 seconds    Comment: Performed at [facility], [address], [place], [place] [address]  CBC     Status: None   Collection Time: 04/11/23  2:43 PM  Result Value Ref Range   WBC 10.1 4.0 - 10.5 K/uL  RBC 5.62 4.22 - 5.81 MIL/uL   Hemoglobin 16.4 13.0 - 17.0 g/dL   HCT 09.8 11.9 - 14.7 %   MCV 82.6 80.0 - 100.0 fL   MCH 29.2 26.0 - 34.0 pg   MCHC 35.3 30.0 - 36.0 g/dL   RDW 82.9 56.2 - 13.0 %   Platelets 246 150 - 400 K/uL   nRBC 0.0 0.0 - 0.2 %    Comment: Performed at [facility], [address], [place], [place] [address]  Differential     Status: None   Collection Time:  04/11/23  2:43 PM  Result Value Ref Range   Neutrophils Relative % 50 %   Neutro Abs 5.2 1.7 - 7.7 K/uL   Lymphocytes Relative 38 %   Lymphs Abs 3.8 0.7 - 4.0 K/uL   Monocytes Relative 6 %   Monocytes Absolute 0.6 0.1 - 1.0 K/uL   Eosinophils Relative 4 %   Eosinophils Absolute 0.4 0.0 - 0.5 K/uL   Basophils Relative 1 %   Basophils Absolute 0.1 0.0 - 0.1 K/uL   Immature Granulocytes 1 %   Abs Immature Granulocytes 0.06 0.00 - 0.07 K/uL    Comment: Performed at [facility], [address], [place], [place] [address]  Comprehensive metabolic panel     Status: Abnormal   Collection Time: 04/11/23  2:43 PM  Result Value Ref Range   Sodium 134 (L) 135 - 145 mmol/L   Potassium 4.0 3.5 - 5.1 mmol/L   Chloride 101 98 - 111 mmol/L   CO2 22 22 - 32 mmol/L   Glucose, Bld 322 (H) 70 - 99 mg/dL    Comment: Glucose reference range applies only to samples taken after fasting for at least 8 hours.   BUN 11 6 - 20 mg/dL   Creatinine, Ser 9.52 0.61 - 1.24 mg/dL   Calcium 9.2 8.9 - 84.1 mg/dL   Total Protein 7.0 6.5 - 8.1 g/dL   Albumin 3.7 3.5 - 5.0 g/dL   AST 24 15 - 41 U/L   ALT 32 0 - 44 U/L   Alkaline Phosphatase 105 38 - 126 U/L   Total Bilirubin 0.7 0.3 - 1.2 mg/dL   GFR, Estimated >32 >44 mL/min    Comment: (NOTE) Calculated using the CKD-EPI Creatinine Equation (2021)    Anion gap 11 5 - 15    Comment: Performed at [facility], [address] [place], [place] [address]  Ethanol     Status: None   Collection Time: 04/11/23  2:43 PM  Result Value Ref Range   Alcohol, Ethyl (B) <10 <10 mg/dL    Comment: (NOTE) Lowest detectable limit for serum alcohol is 10 mg/dL.  For medical purposes only. Performed at [facility], [address], [place], [place] [address]   I-stat chem 8, ED     Status: Abnormal   Collection Time: 04/11/23  2:53 PM  Result Value Ref Range   Sodium 136 135 - 145 mmol/L   Potassium 4.0 3.5 - 5.1 mmol/L   Chloride 103 98 - 111 mmol/L    BUN 13 6 - 20 mg/dL   Creatinine, Ser 4.40 (L) 0.61 - 1.24 mg/dL   Glucose, Bld 347 (H) 70 - 99 mg/dL    Comment: Glucose reference range applies only to samples taken after fasting for at least 8 hours.   Calcium, Ion 1.17 1.15 - 1.40 mmol/L   TCO2 25 22 - 32 mmol/L  Hemoglobin 16.0 13.0 - 17.0 g/dL   HCT 40.9 81.1 - 91.4 %  Hemoglobin A1c     Status: Abnormal   Collection Time: 04/11/23  9:06 PM  Result Value Ref Range   Hgb A1c MFr Bld 10.5 (H) 4.8 - 5.6 %    Comment: (NOTE) Pre diabetes:          5.7%-6.4%  Diabetes:              >6.4%  Glycemic control for   <7.0% adults with diabetes    Mean Plasma Glucose 254.65 mg/dL    Comment: Performed at [facility], [address], [place], [place] [address]  HIV Antibody (routine testing w rflx)     Status: None   Collection Time: 04/11/23  9:06 PM  Result Value Ref Range   HIV Screen 4th Generation wRfx Non Reactive Non Reactive    Comment: Performed at [facility], [address] [place], [place] [address]  CBC     Status: Abnormal   Collection Time: 04/11/23  9:06 PM  Result Value Ref Range   WBC 9.7 4.0 - 10.5 K/uL   RBC 5.22 4.22 - 5.81 MIL/uL   Hemoglobin 15.8 13.0 - 17.0 g/dL   HCT 86.5 78.4 - 69.6 %   MCV 83.7 80.0 - 100.0 fL   MCH 30.3 26.0 - 34.0 pg   MCHC 36.2 (H) 30.0 - 36.0 g/dL   RDW 29.5 28.4 - 13.2 %   Platelets 238 150 - 400 K/uL   nRBC 0.0 0.0 - 0.2 %    Comment: Performed at [facility], [address], [place], [place] [address]  Creatinine, serum     Status: None   Collection Time: 04/11/23  9:06 PM  Result Value Ref Range   Creatinine, Ser 0.90 0.61 - 1.24 mg/dL   GFR, Estimated >27 >25 mL/min    Comment: (NOTE) Calculated using the CKD-EPI Creatinine Equation (2021) Performed at [facility], [address], [place], [place] [address]   TSH     Status: None   Collection Time: 04/11/23  9:06 PM  Result Value Ref Range   TSH 2.298 0.350 - 4.500 uIU/mL    Comment:  Performed by a 3rd Generation assay with a functional sensitivity of <=0.01 uIU/mL. Performed at [facility], [address], [place], [place] [address]   Glucose, capillary     Status: Abnormal   Collection Time: 04/11/23 10:07 PM  Result Value Ref Range   Glucose-Capillary 356 (H) 70 - 99 mg/dL    Comment: Glucose reference range applies only to samples taken after fasting for at least 8 hours.   Comment 1 Notify RN   Lipid panel     Status: Abnormal   Collection Time: 04/12/23  3:50 AM  Result Value Ref Range   Cholesterol 127 0 - 200 mg/dL   Triglycerides 259 (H) <150 mg/dL   HDL 24 (L) >56 mg/dL   Total CHOL/HDL Ratio 5.3 RATIO   VLDL UNABLE TO CALCULATE IF TRIGLYCERIDE OVER 400 mg/dL 0 - 40 mg/dL   LDL Cholesterol UNABLE TO CALCULATE IF TRIGLYCERIDE OVER 400 mg/dL 0 - 99 mg/dL    Comment:        Total Cholesterol/HDL:CHD Risk Coronary Heart Disease Risk Table                     Men   Women  1/2 Average Risk  3.4   3.3  Average Risk       5.0   4.4  2 X Average Risk   9.6   7.1  3 X Average Risk  23.4   11.0        Use the calculated Patient Ratio above and the CHD Risk Table to determine the patient's CHD Risk.        ATP III CLASSIFICATION (LDL):  <100     mg/dL   Optimal  161-096  mg/dL   Near or Above                    Optimal  130-159  mg/dL   Borderline  045-409  mg/dL   High  >811     mg/dL   Very High Performed at [facility], [address], [place], [place] [address]   LDL cholesterol, direct     Status: None   Collection Time: 04/12/23  3:50 AM  Result Value Ref Range   Direct LDL 39 0 - 99 mg/dL    Comment: Performed at [facility], [address], [place], [place] [address]  Glucose, capillary     Status: Abnormal   Collection Time: 04/12/23  6:14 AM  Result Value Ref Range   Glucose-Capillary 271 (H) 70 - 99 mg/dL    Comment: Glucose reference range applies only to samples taken after fasting for at least 8 hours.   Comment  1 Notify RN   Glucose, capillary     Status: Abnormal   Collection Time: 04/12/23  7:23 AM  Result Value Ref Range   Glucose-Capillary 261 (H) 70 - 99 mg/dL    Comment: Glucose reference range applies only to samples taken after fasting for at least 8 hours.  Glucose, capillary     Status: Abnormal   Collection Time: 04/12/23 11:14 AM  Result Value Ref Range   Glucose-Capillary 250 (H) 70 - 99 mg/dL    Comment: Glucose reference range applies only to samples taken after fasting for at least 8 hours.  ECHOCARDIOGRAM COMPLETE     Status: None   Collection Time: 04/12/23  3:37 PM  Result Value Ref Range   Weight 4,606.73 oz   Height 74 in   BP 152/102 mmHg   Single Plane A2C EF 52.2 %   Single Plane A4C EF 52.1 %   Calc EF 52.1 %   S' Lateral 2.90 cm   Area-P 1/2 3.53 cm2   Est EF 60 - 65%   Glucose, capillary     Status: Abnormal   Collection Time: 04/12/23  4:15 PM  Result Value Ref Range   Glucose-Capillary 226 (H) 70 - 99 mg/dL    Comment: Glucose reference range applies only to samples taken after fasting for at least 8 hours.     Review of Systems: Patient complains of symptoms per HPI as well as the following symptoms fatigued. Pertinent negatives and positives per HPI. All others negative.   Social History   Socioeconomic History   Marital status: Married    Spouse name: Not on file   Number of children: Not on file   Years of education: Not on file   Highest education level: Not on file  Occupational History   Not on file  Tobacco Use   Smoking status: Former    Types: Cigars   Smokeless tobacco: Current    Types: Snuff, Chew  Vaping Use   Vaping status: Never  Used  Substance and Sexual Activity   Alcohol use: Yes    Comment: social   Drug use: No   Sexual activity: Not on file  Other Topics Concern   Not on file  Social History Narrative   Not on file   Social Determinants of Health   Financial Resource Strain: Not on file  Food Insecurity: Not  on file  Transportation Needs: Not on file  Physical Activity: Not on file  Stress: Not on file  Social Connections: Not on file  Intimate Partner Violence: Not on file    Family History  Problem Relation Age of Onset   Colon cancer Maternal Grandfather    Stomach cancer Maternal Grandfather     Past Medical History:  Diagnosis Date   ADHD (attention deficit hyperactivity disorder)    Back pain    Complication of anesthesia    Patient states he has been violent in the past after waking up from anesthsia   Diabetes mellitus without complication (HCC)    Hypertension     Patient Active Problem List   Diagnosis Date Noted   TIA (transient ischemic attack) 04/12/2023   Hyperlipidemia 04/12/2023   CVA (cerebral vascular accident) (HCC) 04/11/2023   Stroke-like symptoms 04/11/2023   History of colonic polyps    Obesity, Class III, BMI 40-49.9 (morbid obesity) (HCC) 12/19/2019   Pancolitis (HCC) 12/16/2019   SIRS (systemic inflammatory response syndrome) (HCC) 12/16/2019   Polycythemia 12/16/2019   Type 2 diabetes mellitus (HCC) 12/16/2019   HTN (hypertension) 12/16/2019    Past Surgical History:  Procedure Laterality Date   BIOPSY  12/17/2019   Procedure: BIOPSY;  Surgeon: Charna Elizabeth, MD;  Location: [facility] ENDOSCOPY;  Service: Endoscopy;;   COLONOSCOPY N/A 12/17/2019   Procedure: COLONOSCOPY;  Surgeon: Charna Elizabeth, MD;  Location: [facility] ENDOSCOPY;  Service: Endoscopy;  Laterality: N/A;   INSERTION OF MESH N/A 08/26/2021   Procedure: INSERTION OF MESH;  Surgeon: Violeta Gelinas, MD;  Location: [facility] OR;  Service: General;  Laterality: N/A;   POLYPECTOMY  12/17/2019   Procedure: POLYPECTOMY;  Surgeon: Charna Elizabeth, MD;  Location: [facility] ENDOSCOPY;  Service: Endoscopy;;   UMBILICAL HERNIA REPAIR N/A 08/26/2021   Procedure: REPAIR UMBILICAL HERNIA WITH MESH;  Surgeon: Violeta Gelinas, MD;  Location: [facility] OR;  Service: General;  Laterality: N/A;   UMBILICAL HERNIA REPAIR     WISDOM TOOTH  EXTRACTION Bilateral     Current Outpatient Medications  Medication Sig Dispense Refill   acetaminophen (TYLENOL) 500 MG tablet Take 1,000 mg by mouth daily as needed for moderate pain, fever or headache.     Blood Glucose Monitoring Suppl DEVI 1 each by Does not apply route 3 (three) times daily. May dispense any manufacturer covered by patient's insurance. 1 each 0   clopidogrel (PLAVIX) 75 MG tablet Take 1 tablet (75 mg total) by mouth daily. 30 tablet 11   Continuous Glucose Sensor (FREESTYLE LIBRE 3 SENSOR) MISC USED TO CHECK BLOOD SUGARS, CHANGE SENSOR EVERY 14 DAYS DX: E11.65 28 DAYS     gabapentin (NEURONTIN) 400 MG capsule Take 400 mg by mouth daily as needed (neuropathy, leg pain).     insulin NPH-regular Human (NOVOLIN 70/30) (70-30) 100 UNIT/ML injection Inject 22 Units into the skin 2 (two) times daily with a meal. 10 mL 11   Insulin Syringe-Needle U-100 25G X 5/8" 1 ML MISC 1 each by Does not apply route 3 (three) times daily. May dispense any manufacturer covered by patient's insurance. 100 each 0  lisinopril-hydrochlorothiazide (ZESTORETIC) 10-12.5 MG tablet Take 1 tablet by mouth daily. 30 tablet 3   rosuvastatin (CRESTOR) 40 MG tablet Take 1 tablet (40 mg total) by mouth at bedtime. 30 tablet 3   No current facility-administered medications for this visit.    Allergies as of 05/29/2023 - Review Complete 05/29/2023  Allergen Reaction Noted   Asa [aspirin] Other (See Comments) 04/11/2023   Lipitor [atorvastatin] Other (See Comments) 04/11/2023   Metformin and related Diarrhea 08/18/2021   Penicillins Other (See Comments) 12/22/2014    Vitals: BP 133/83   Pulse 73   Resp 14   Ht 6\' 2"  (1.88 m)   BMI 36.97 kg/m  Last Weight:  Wt Readings from Last 1 Encounters:  04/11/23 287 lb 14.7 oz (130.6 kg)   Last Height:   Ht Readings from Last 1 Encounters:  05/29/23 6\' 2"  (1.88 m)     Physical exam: Exam: Gen: NAD, conversant, well nourised, obese, well groomed                      CV: RRR, no MRG. No Carotid Bruits. No peripheral edema, warm, nontender Eyes: Conjunctivae clear without exudates or hemorrhage  Neuro: Detailed Neurologic Exam  Speech:    Speech is normal; fluent and spontaneous with normal comprehension.  Cognition:    The patient is oriented to person, place, and time;     recent and remote memory intact;     language fluent;     normal attention, concentration,     fund of knowledge Cranial Nerves:    The pupils are equal, round, and reactive to light. The fundi are normal and spontaneous venous pulsations are present. Visual fields are full to finger confrontation. Extraocular movements are intact. Trigeminal sensation is intact and the muscles of mastication are normal. The face is symmetric. The palate elevates in the midline. Hearing intact. Voice is normal. Shoulder shrug is normal. The tongue has normal motion without fasciculations.   Coordination: nml  Gait: nml  Motor Observation:    No asymmetry, no atrophy, and no involuntary movements noted. Tone:    Normal muscle tone.    Posture:    Posture is normal. normal erect    Strength:    Strength is V/V in the upper and lower limbs.      Sensation: intact to LT     Reflex Exam:  DTR's:    Deep tendon reflexes in the upper and lower extremities are symmetrical bilaterally.   Toes:    The toes are downgoing bilaterally.   Clonus:    Clonus is absent.    Assessment/Plan:  Patient with TIA. Multiple vascular risk factors. Discussed stroke prevention and minimizing risk by closely monitoring vascular risk factors. They want to give my recommendations to pcp to order. has Pancolitis (HCC); SIRS (systemic inflammatory response syndrome) (HCC); Polycythemia; Type 2 diabetes mellitus (HCC); HTN (hypertension); Obesity, Class III, BMI 40-49.9 (morbid obesity) (HCC); History of colonic polyps; CVA (cerebral vascular accident) (HCC); Stroke-like symptoms; TIA (transient  ischemic attack); and Hyperlipidemia on their problem list.   Make sure PFO (transcranial doppler with bubble study) Make sure you don't have afib (30-day heart monitor and consider a loop recorder) Had sleep apnea test I will make these recs to pcp Evangeline Dakin, PA Was on DUAP 21 days then plavix alone Crestor ldl goal < 70 Started Novolin 70/30 22units BID goal HgbA1c < 6  I had a long d/w patient about his recent TIA,  risk for recurrent stroke/TIAs, personally independently reviewed imaging studies and stroke evaluation results and answered questions.Continue Plavix  for secondary stroke prevention and maintain strict control of hypertension with blood pressure goal below 130/90, diabetes with hemoglobin A1c goal below 6.0% and lipids with LDL cholesterol goal below 70 mg/dL. I also advised the patient to eat a healthy diet with plenty of whole grains, lean protein, fruits and vegetables, exercise regularly and maintain ideal body weight .Followup in the future as needed with neurology, release to pcp.     Cc: Cathren Harsh, MD,  [place], [place], [place]  Naomie Dean, MD  [facility] [address] [place], [place] [address]  Phone [phone] Fax [phone]

## 2023-06-07 ENCOUNTER — Encounter: Payer: Self-pay | Admitting: Neurology

## 2023-09-05 ENCOUNTER — Emergency Department (HOSPITAL_COMMUNITY): Payer: BC Managed Care – PPO

## 2023-09-05 ENCOUNTER — Other Ambulatory Visit: Payer: Self-pay

## 2023-09-05 ENCOUNTER — Encounter (HOSPITAL_COMMUNITY): Payer: Self-pay | Admitting: Emergency Medicine

## 2023-09-05 ENCOUNTER — Emergency Department (HOSPITAL_COMMUNITY)
Admission: EM | Admit: 2023-09-05 | Discharge: 2023-09-05 | Disposition: A | Discharge status: Home / Self Care | Payer: BC Managed Care – PPO | Attending: Emergency Medicine | Admitting: Emergency Medicine

## 2023-09-05 DIAGNOSIS — E119 Type 2 diabetes mellitus without complications: Secondary | ICD-10-CM | POA: Diagnosis not present

## 2023-09-05 DIAGNOSIS — R1084 Generalized abdominal pain: Secondary | ICD-10-CM | POA: Diagnosis not present

## 2023-09-05 DIAGNOSIS — R197 Diarrhea, unspecified: Secondary | ICD-10-CM | POA: Diagnosis present

## 2023-09-05 DIAGNOSIS — Z794 Long term (current) use of insulin: Secondary | ICD-10-CM | POA: Diagnosis not present

## 2023-09-05 DIAGNOSIS — Z7902 Long term (current) use of antithrombotics/antiplatelets: Secondary | ICD-10-CM | POA: Insufficient documentation

## 2023-09-05 DIAGNOSIS — Z8673 Personal history of transient ischemic attack (TIA), and cerebral infarction without residual deficits: Secondary | ICD-10-CM | POA: Diagnosis not present

## 2023-09-05 DIAGNOSIS — R112 Nausea with vomiting, unspecified: Secondary | ICD-10-CM | POA: Insufficient documentation

## 2023-09-05 HISTORY — DX: Noninfective gastroenteritis and colitis, unspecified: K52.9

## 2023-09-05 LAB — CBC
HCT: 58.3 % — ABNORMAL HIGH (ref 39.0–52.0)
Hemoglobin: 20.7 g/dL — ABNORMAL HIGH (ref 13.0–17.0)
MCH: 28.5 pg (ref 26.0–34.0)
MCHC: 35.5 g/dL (ref 30.0–36.0)
MCV: 80.3 fL (ref 80.0–100.0)
Platelets: 356 10*3/uL (ref 150–400)
RBC: 7.26 MIL/uL — ABNORMAL HIGH (ref 4.22–5.81)
RDW: 13.5 % (ref 11.5–15.5)
WBC: 16.8 10*3/uL — ABNORMAL HIGH (ref 4.0–10.5)
nRBC: 0 % (ref 0.0–0.2)

## 2023-09-05 LAB — URINALYSIS, ROUTINE W REFLEX MICROSCOPIC
Bacteria, UA: NONE SEEN
Bilirubin Urine: NEGATIVE
Glucose, UA: >=500 mg/dL — AB
Hgb urine dipstick: NEGATIVE
Ketones, ur: 5 mg/dL — AB
Leukocytes,Ua: NEGATIVE
Nitrite: NEGATIVE
Protein, ur: 100 mg/dL — AB
Specific Gravity, Urine: 1.036 — ABNORMAL HIGH (ref 1.005–1.030)
pH: 6 (ref 5.0–8.0)

## 2023-09-05 LAB — COMPREHENSIVE METABOLIC PANEL
ALT: 30 U/L (ref 0–44)
AST: 18 U/L (ref 15–41)
Albumin: 3.9 g/dL (ref 3.5–5.0)
Alkaline Phosphatase: 90 U/L (ref 38–126)
Anion gap: 13 (ref 5–15)
BUN: 26 mg/dL — ABNORMAL HIGH (ref 6–20)
CO2: 14 mmol/L — ABNORMAL LOW (ref 22–32)
Calcium: 9.2 mg/dL (ref 8.9–10.3)
Chloride: 104 mmol/L (ref 98–111)
Creatinine, Ser: 0.92 mg/dL (ref 0.61–1.24)
GFR, Estimated: >60 mL/min (ref >60)
Glucose, Bld: 357 mg/dL — ABNORMAL HIGH (ref 70–99)
Potassium: 4.4 mmol/L (ref 3.5–5.1)
Sodium: 131 mmol/L — ABNORMAL LOW (ref 135–145)
Total Bilirubin: 1.4 mg/dL — ABNORMAL HIGH (ref <1.2)
Total Protein: 7.5 g/dL (ref 6.5–8.1)

## 2023-09-05 LAB — TROPONIN I (HIGH SENSITIVITY): Troponin I (High Sensitivity): 22 ng/L — ABNORMAL HIGH (ref <18)

## 2023-09-05 LAB — CBG MONITORING, ED: Glucose-Capillary: 407 mg/dL — ABNORMAL HIGH (ref 70–99)

## 2023-09-05 LAB — LIPASE, BLOOD: Lipase: 24 U/L (ref 11–51)

## 2023-09-05 MED ORDER — ONDANSETRON HCL 4 MG PO TABS
4.0000 mg | ORAL_TABLET | Freq: Four times a day (QID) | ORAL | 0 refills | Status: AC | PRN
Start: 2023-09-05 — End: ?

## 2023-09-05 MED ORDER — ONDANSETRON HCL 4 MG/2ML IJ SOLN
4.0000 mg | Freq: Once | INTRAMUSCULAR | Status: AC
  Administered 2023-09-05: 4 mg via INTRAVENOUS
  Filled 2023-09-05: qty 2

## 2023-09-05 MED ORDER — SODIUM CHLORIDE 0.9 % IV BOLUS
1000.0000 mL | Freq: Once | INTRAVENOUS | Status: AC
  Administered 2023-09-05: 1000 mL via INTRAVENOUS

## 2023-09-05 MED ORDER — IOPAMIDOL (ISOVUE-370) INJECTION 76%
75.0000 mL | Freq: Once | INTRAVENOUS | Status: AC | PRN
  Administered 2023-09-05: 75 mL via INTRAVENOUS

## 2023-09-05 MED ORDER — MORPHINE SULFATE (PF) 4 MG/ML IV SOLN
4.0000 mg | Freq: Once | INTRAVENOUS | Status: AC
  Administered 2023-09-05: 4 mg via INTRAVENOUS
  Filled 2023-09-05: qty 1

## 2023-09-05 MED ORDER — DICYCLOMINE HCL 20 MG PO TABS: 20.0000 mg | ORAL_TABLET | Freq: Two times a day (BID) | ORAL | 0 refills | Status: DC

## 2023-09-05 NOTE — ED Notes (Signed)
Pt was stuck. Wasn't successful.

## 2023-09-05 NOTE — ED Triage Notes (Signed)
Pt. Stated, this started yesterday morning around 56 with my stomach hurting N/V/D and when I was in the shower this morning I fell because I was weak. I just got real weak, IPt did not hit his head.

## 2023-09-05 NOTE — ED Provider Notes (Signed)
[name] AT [facility] Provider Note   CSN: [id number] Arrival date & time: 09/05/23  4098     History  Chief Complaint  Patient presents with   Nausea   Emesis   Diarrhea   Weakness    Patrick Munoz is a 37 y.o. male with medical history of ADHD, back pain, pancolitis, type 2 diabetes, ulcerative colitis, TIA on Plavix.  Patient presents to ED for evaluation of nausea, vomiting, generalized weakness, blood in stool and abdominal pain.  Reports that all the symptoms began yesterday.  States that he has had excessive diarrhea and nausea and vomiting for the last 24 hours.  States he also had an episode of bright red blood in his stool associated with some rectal pain and blood on toilet paper.  Denies history of the same in terms of his ulcerative colitis.  Reports he has not been on medication for his ulcerative colitis for over 1 year due to insurance issues.  Reports his blood sugars are typically well-controlled however here in the ED point-of-care blood glucose shows 407.  States that he did not take his insulin yesterday because he was feeling so poorly.  Denies fevers, dysuria, one-sided weakness.  Reports he is currently on Plavix for recent TIA and states he fell in the shower this morning but denies any his head.  Reports that he lowered himself to the ground on his hands and his knees.   Emesis Associated symptoms: abdominal pain and diarrhea   Associated symptoms: no fever   Diarrhea Associated symptoms: abdominal pain and vomiting   Associated symptoms: no fever   Weakness Associated symptoms: abdominal pain, diarrhea and vomiting   Associated symptoms: no dysuria and no fever        Home Medications Prior to Admission medications   Medication Sig Start Date End Date Taking? Authorizing Provider  acetaminophen (TYLENOL) 500 MG tablet Take 1,000 mg by mouth daily as needed for moderate pain, fever or headache.   Yes [provider]  clopidogrel (PLAVIX) 75 MG tablet Take 1 tablet (75 mg total) by mouth daily. 04/12/23 04/11/24 Yes Rai, Ripudeep K, MD  dicyclomine (BENTYL) 20 MG tablet Take 1 tablet (20 mg total) by mouth 2 (two) times daily. 09/05/23  Yes Al Decant, PA-C  gabapentin (NEURONTIN) 400 MG capsule Take 400 mg by mouth daily as needed (neuropathy, leg pain).   Yes [provider]  insulin NPH-regular Human (NOVOLIN 70/30) (70-30) 100 UNIT/ML injection Inject 22 Units into the skin 2 (two) times daily with a meal. Patient taking differently: Inject 40 Units into the skin 2 (two) times daily with a meal. 04/12/23  Yes Rai, Ripudeep K, MD  lisinopril-hydrochlorothiazide (ZESTORETIC) 10-12.5 MG tablet Take 1 tablet by mouth daily. 04/12/23  Yes Rai, Ripudeep K, MD  NOVOLIN R 100 UNIT/ML injection Inject 20 Units into the skin in the morning and at bedtime. Before largest meal and at bedtime. 08/30/23  Yes [provider]  ondansetron (ZOFRAN) 4 MG tablet Take 1 tablet (4 mg total) by mouth every 6 (six) hours as needed for nausea or vomiting. 09/05/23  Yes Al Decant, PA-C  rosuvastatin (CRESTOR) 40 MG tablet Take 1 tablet (40 mg total) by mouth at bedtime. 04/12/23  Yes Rai, Ripudeep K, MD  Blood Glucose Monitoring Suppl DEVI 1 each by Does not apply route 3 (three) times daily. May dispense any manufacturer covered by patient's insurance. 04/12/23   Rai, Ripudeep K,  MD  Continuous Glucose Sensor (FREESTYLE LIBRE 3 SENSOR) MISC USED TO CHECK BLOOD SUGARS, CHANGE SENSOR EVERY 14 DAYS DX: E11.65 28 DAYS 05/01/23   [provider]  Insulin Syringe-Needle U-100 25G X 5/8" 1 ML MISC 1 each by Does not apply route 3 (three) times daily. May dispense any manufacturer covered by patient's insurance. 04/12/23   Rai, Delene Ruffini, MD      Allergies    Asa [aspirin], Lipitor [atorvastatin], Metformin and related, and Penicillins    Review of Systems   Review of Systems   Constitutional:  Negative for fever.  Gastrointestinal:  Positive for abdominal pain, diarrhea and vomiting.  Genitourinary:  Negative for dysuria.  Neurological:  Positive for weakness.  All other systems reviewed and are negative.   Physical Exam Updated Vital Signs BP (!) 143/76   Pulse 68   Temp 98.3 F (36.8 C)   Resp 18   Ht 6\' 3"  (1.905 m)   Wt 127 kg   SpO2 96%   BMI 35.00 kg/m  Physical Exam Vitals and nursing note reviewed.  Constitutional:      General: He is not in acute distress.    Appearance: Normal appearance. He is not ill-appearing, toxic-appearing or diaphoretic.  HENT:     Head: Normocephalic and atraumatic.     Nose: Nose normal.     Mouth/Throat:     Mouth: Mucous membranes are moist.     Pharynx: Oropharynx is clear.  Eyes:     Extraocular Movements: Extraocular movements intact.     Conjunctiva/sclera: Conjunctivae normal.     Pupils: Pupils are equal, round, and reactive to light.  Cardiovascular:     Rate and Rhythm: Regular rhythm. Tachycardia present.  Pulmonary:     Effort: Pulmonary effort is normal.     Breath sounds: Normal breath sounds. No wheezing.  Abdominal:     General: Abdomen is flat. Bowel sounds are normal.     Palpations: Abdomen is soft.     Tenderness: There is no abdominal tenderness.  Musculoskeletal:     Cervical back: Normal range of motion and neck supple.  Skin:    General: Skin is warm and dry.     Capillary Refill: Capillary refill takes less than 2 seconds.  Neurological:     Mental Status: He is alert and oriented to person, place, and time.     Motor: No weakness.     Comments: 5 out of 5 strength bilateral lower extremities     ED Results / Procedures / Treatments   Labs (all labs ordered are listed, but only abnormal results are displayed) Labs Reviewed  CBC - Abnormal; Notable for the following components:      Result Value   WBC 16.8 (*)    RBC 7.26 (*)    Hemoglobin 20.7 (*)    HCT 58.3 (*)     All other components within normal limits  URINALYSIS, ROUTINE W REFLEX MICROSCOPIC - Abnormal; Notable for the following components:   Color, Urine AMBER (*)    APPearance HAZY (*)    Specific Gravity, Urine 1.036 (*)    Glucose, UA >=500 (*)    Ketones, ur 5 (*)    Protein, ur 100 (*)    All other components within normal limits  COMPREHENSIVE METABOLIC PANEL - Abnormal; Notable for the following components:   Sodium 131 (*)    CO2 14 (*)    Glucose, Bld 357 (*)    BUN 26 (*)  Total Bilirubin 1.4 (*)    All other components within normal limits  CBG MONITORING, ED - Abnormal; Notable for the following components:   Glucose-Capillary 407 (*)    All other components within normal limits  TROPONIN I (HIGH SENSITIVITY) - Abnormal; Notable for the following components:   Troponin I (High Sensitivity) 22 (*)    All other components within normal limits  LIPASE, BLOOD  POC OCCULT BLOOD, ED    EKG None  Radiology CT ABDOMEN PELVIS W CONTRAST  Result Date: 09/05/2023 CLINICAL DATA:  Left lower quadrant pain.  Ulcerative colitis. EXAM: CT ABDOMEN AND PELVIS WITH CONTRAST TECHNIQUE: Multidetector CT imaging of the abdomen and pelvis was performed using the standard protocol following bolus administration of intravenous contrast. RADIATION DOSE REDUCTION: This exam was performed according to the departmental dose-optimization program which includes automated exposure control, adjustment of the mA and/or kV according to patient size and/or use of iterative reconstruction technique. CONTRAST:  75mL ISOVUE-370 IOPAMIDOL (ISOVUE-370) INJECTION 76% COMPARISON:  11/20/2021 FINDINGS: Lower Chest: No acute findings. Hepatobiliary: No suspicious hepatic masses identified. Probable tiny gallstones noted, however, there are no signs of cholecystitis or biliary ductal dilatation. Pancreas:  No mass or inflammatory changes. Spleen: Within normal limits in size and appearance. Adrenals/Urinary Tract:  No suspicious masses identified. No evidence of ureteral calculi or hydronephrosis. Stomach/Bowel: No evidence of obstruction, inflammatory process or abnormal fluid collections. Fluid noted throughout the colon, which is a nonspecific finding associated with diarrheal illness. Vascular/Lymphatic: No pathologically enlarged lymph nodes. No acute vascular findings. Reproductive:  No mass or other significant abnormality. Other:  None. Musculoskeletal:  No suspicious bone lesions identified. IMPRESSION: Fluid throughout the colon, which is a nonspecific finding associated with diarrheal illness. No radiographic evidence of colitis. Probable cholelithiasis. No radiographic evidence of cholecystitis. Electronically Signed   By: Danae Orleans M.D.   On: 09/05/2023 15:59    Procedures Procedures   Medications Ordered in ED Medications  sodium chloride 0.9 % bolus 1,000 mL (0 mLs Intravenous Stopped 09/05/23 1102)  ondansetron (ZOFRAN) injection 4 mg (4 mg Intravenous Given 09/05/23 0919)  morphine (PF) 4 MG/ML injection 4 mg (4 mg Intravenous Given 09/05/23 0919)  iopamidol (ISOVUE-370) 76 % injection 75 mL (75 mLs Intravenous Contrast Given 09/05/23 1331)    ED Course/ Medical Decision Making/ A&P  Medical Decision Making Amount and/or Complexity of Data Reviewed Labs: ordered. Radiology: ordered.  Risk Prescription drug management.   37 year old male presents to the ED for evaluation.  Please see HPI for further details.  On exam patient is afebrile, tachycardic.  Lung sounds are clear bilaterally and he is not hypoxic.  Abdomen has tenderness in the left lower quadrant.  Neurological examination at baseline with 5 out of 5 strength bilateral lower extremities.  CBC shows a leukocytosis of 16.8, hemoglobin 20.7.  Patient has documented diagnosis of polycythemia in his chart however patient reports he does not follow with hematology so we will refer him.  CMP with sodium 131, glucose 57, BUN 26  and bicarb 14.  Patient bilirubin 1.4.  Patient urinalysis shows glucose, protein, ketones and increased specific gravity.  Troponin was collected in triage at 22 but the patient denies chest pain or shortness of breath, unsure why this was ordered, suspect metabolic cause.  Patient provided 4 milligrams of morphine, 4 mg of Zofran and 1 L fluid.  Pulse rate has normalized at this time.  Patient Hemoccult card negative.  CT abdomen pelvis shows diffuse fluid through colon.  No signs of UC. Patient will be referred to hematology for ongoing care.  Discussed with Dr. Jeraldine Loots who voices agreement with plan management.  Discussed with patient at the bedside who voices understanding. Will send home with zofran and bentyl.    Final Clinical Impression(s) / ED Diagnoses Final diagnoses:  Generalized abdominal pain  Diarrhea, unspecified type    Rx / DC Orders ED Discharge Orders          Ordered    ondansetron (ZOFRAN) 4 MG tablet  Every 6 hours PRN        09/05/23 1614    dicyclomine (BENTYL) 20 MG tablet  2 times daily        09/05/23 1627              Atticus, Xayavong 09/05/23 1627    Gerhard Munch, MD 09/07/23 [phone]

## 2023-09-05 NOTE — Discharge Instructions (Addendum)
Please follow-up with Dr. Arbutus Ped of oncology regarding your high amount of hemoglobin in your blood.  Please also begin taking your diabetic medications as prescribed.  Please continue to push fluids at home.  Take Zofran every 6 hours as needed for nausea and vomiting.  Please read the attached guide concerning diarrhea.  Please return to the ED with any new or worsening signs or symptoms.

## 2023-12-04 IMAGING — CT CT ABD-PELV W/ CM
2 of 4 series · 16 of 46 positions shown, 18 images · IV contrast (agent unspecified)
Comparison: CT abdomen pelvis 04/24/2021

CLINICAL DATA: Abdominal pain, acute, nonlocalized

EXAM:
CT ABDOMEN AND PELVIS WITH CONTRAST
TECHNIQUE: Multidetector CT imaging of the abdomen and pelvis was performed
using the standard protocol following bolus administration of
intravenous contrast.

[Series 2: abd pel w · axial · 0.96mm/px · z∈[+753,+1243]mm · 13 of 108 slices shown, 15 images]
[im 5/108  soft-tissue]
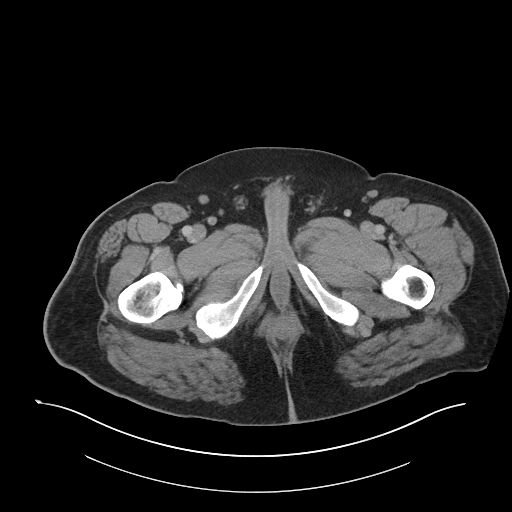
[im 5/108  bone]
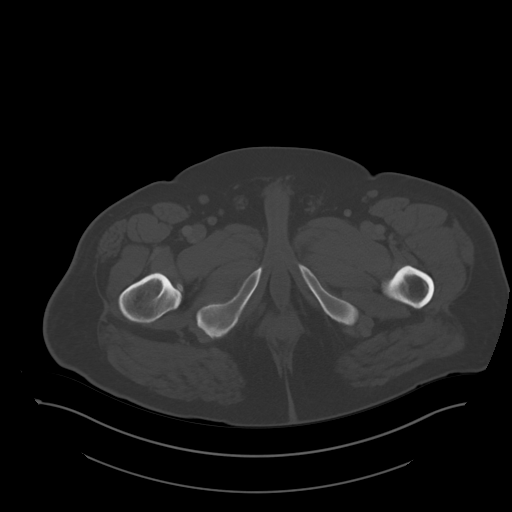
[im 14/108  soft-tissue]
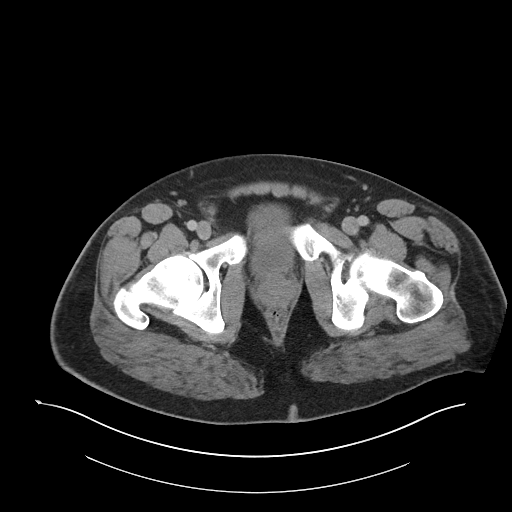
[im 24/108  soft-tissue]
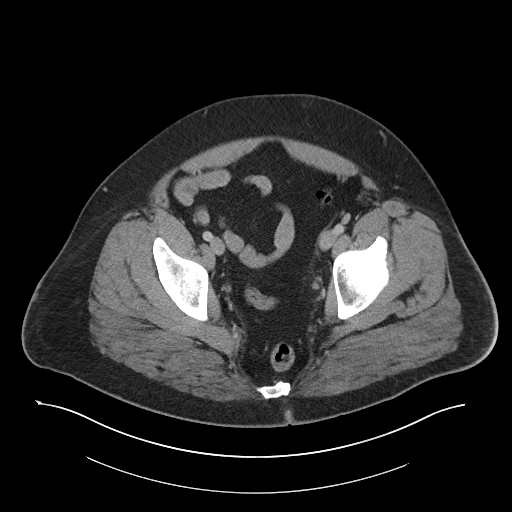
[im 28/108  soft-tissue]
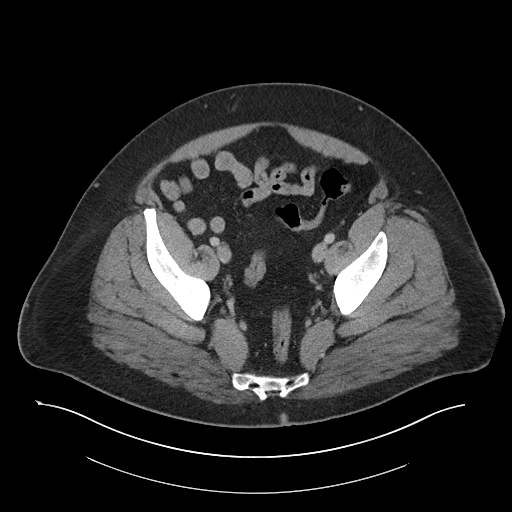
[im 38/108  soft-tissue]
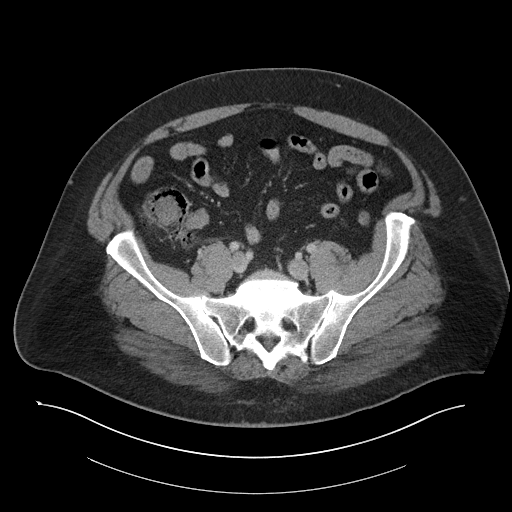
[im 47/108  soft-tissue]
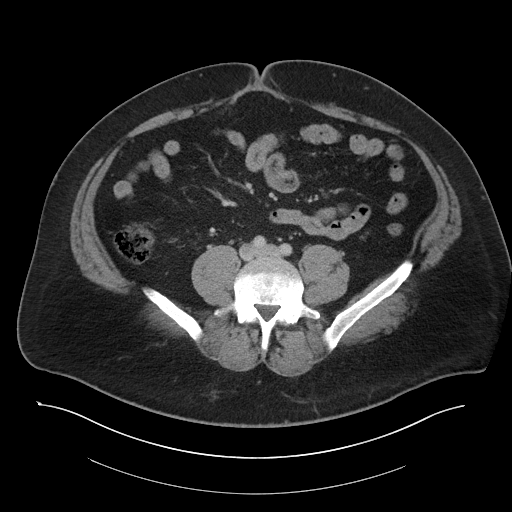
[im 56/108  soft-tissue]
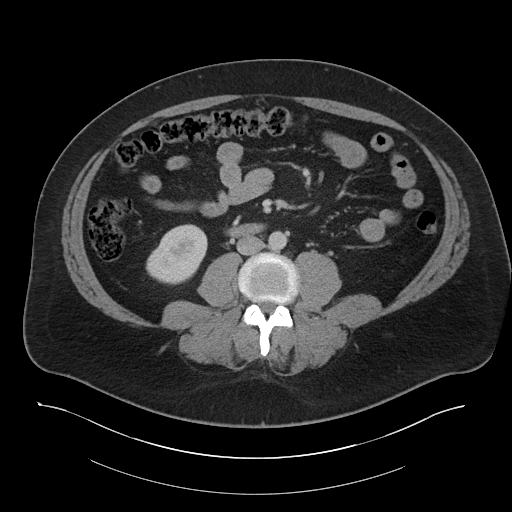
[im 61/108  soft-tissue]
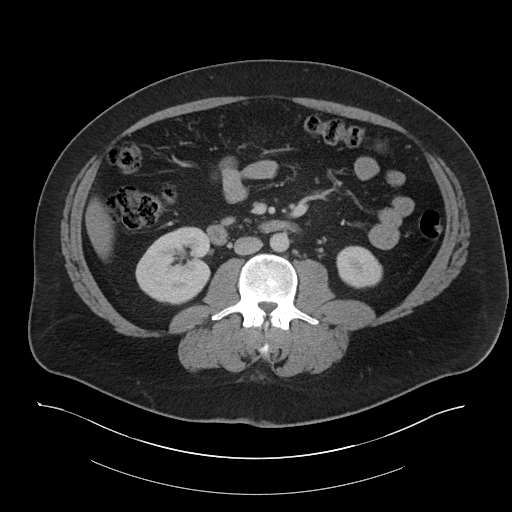
[im 70/108  soft-tissue]
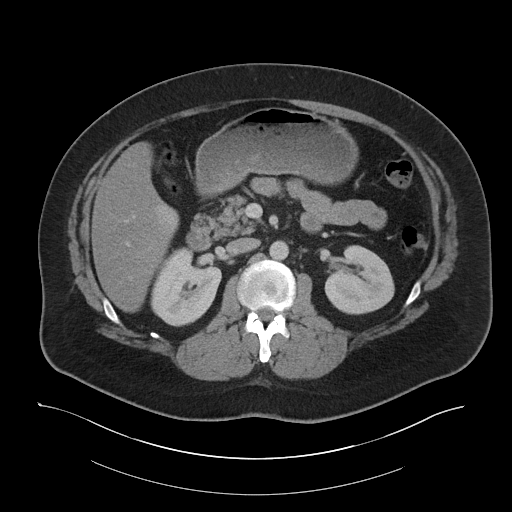
[im 70/108  bone]
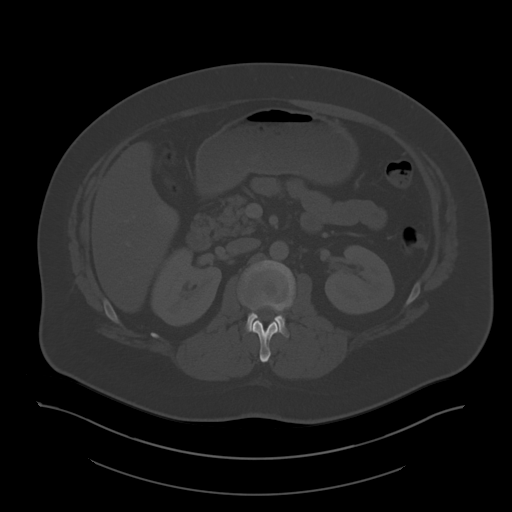
[im 80/108  soft-tissue]
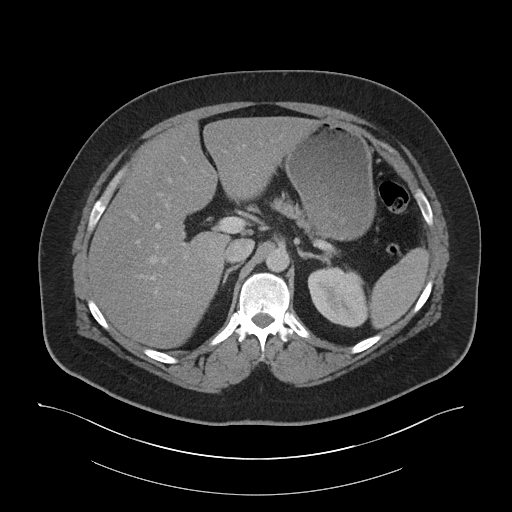
[im 84/108  soft-tissue]
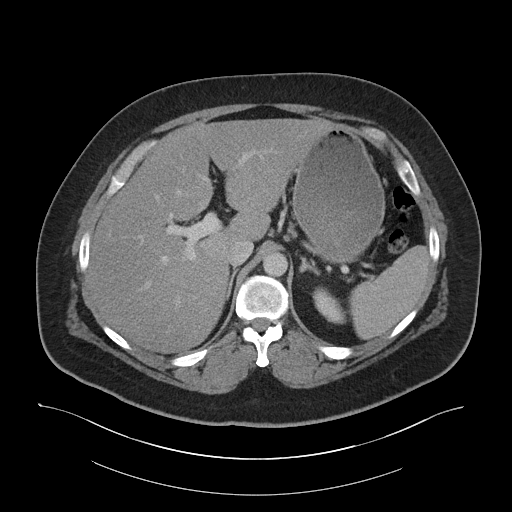
[im 94/108  soft-tissue]
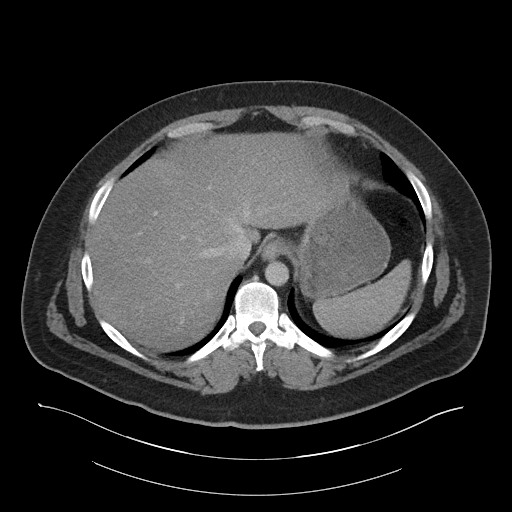
[im 103/108  soft-tissue]
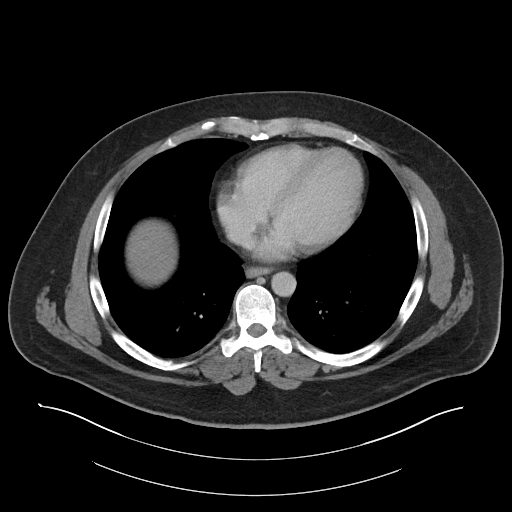

[Series 5: coronal · coronal · 0.96mm/px · 3 of 134 slices shown]
[im 45/134  soft-tissue]
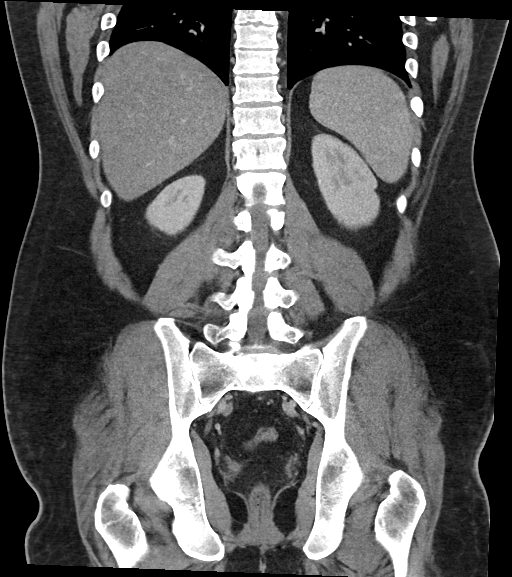
[im 60/134  soft-tissue]
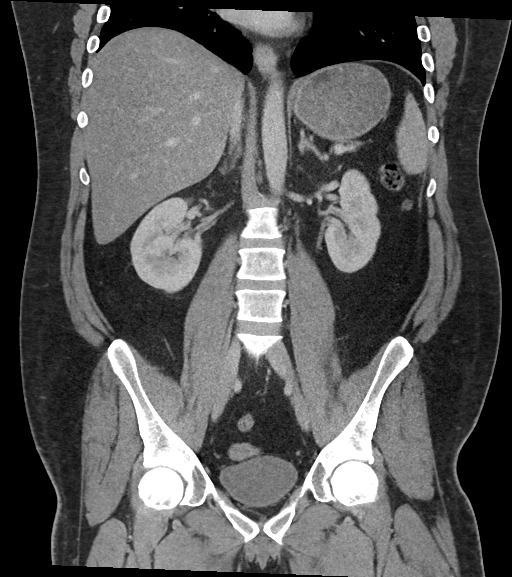
[im 74/134  soft-tissue]
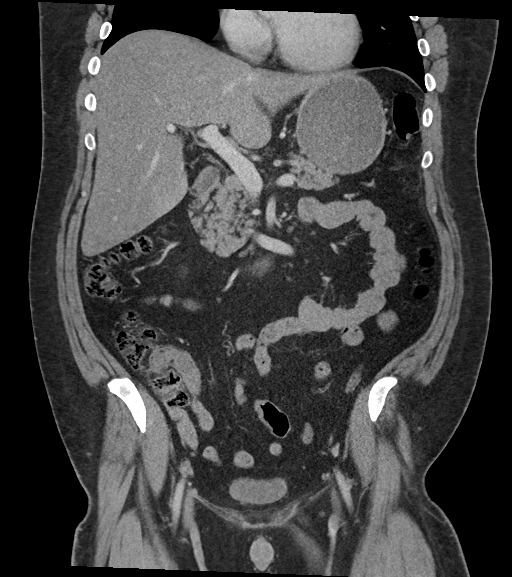

[16 of 46 positions shown; findings below may reference images not displayed]

RADIATION DOSE REDUCTION: This exam was performed according to the
departmental dose-optimization program which includes automated
exposure control, adjustment of the mA and/or kV according to
patient size and/or use of iterative reconstruction technique.

CONTRAST:  100mL OMNIPAQUE IOHEXOL 300 MG/ML  SOLN
FINDINGS: Lower chest: No acute abnormality.

Hepatobiliary: No focal liver abnormality. The gallbladder is
contracted. Hyperdensity within the gallbladder lumen consistent
with cholelithiasis. No gallbladder wall thickening or
pericholecystic fluid. No biliary dilatation.

Pancreas: No focal lesion. Normal pancreatic contour. No surrounding
inflammatory changes. No main pancreatic ductal dilatation.

Spleen: Normal in size without focal abnormality.

Adrenals/Urinary Tract:

No adrenal nodule bilaterally.

Bilateral kidneys enhance symmetrically.

No hydronephrosis. No hydroureter.

The urinary bladder is unremarkable.

Stomach/Bowel: Stomach is within normal limits. No evidence of bowel
wall thickening or dilatation. Appendix appears normal.

Vascular/Lymphatic: Phleboliths within the pelvis. No abdominal
aorta or iliac aneurysm. No abdominal, pelvic, or inguinal
lymphadenopathy.

Reproductive: Prostate is unremarkable.

Other: No intraperitoneal free fluid. No intraperitoneal free gas.
No organized fluid collection.

Musculoskeletal:

No abdominal wall hernia or abnormality.

No suspicious lytic or blastic osseous lesions. No acute displaced
fracture. Nonspecific slightly asymmetric right ischial tuberosity
right to left.
IMPRESSION: 1. No acute intra-abdominal or intrapelvic abnormality.
2. Cholelithiasis with no CT findings of acute cholecystitis.

## 2024-04-17 ENCOUNTER — Emergency Department (HOSPITAL_BASED_OUTPATIENT_CLINIC_OR_DEPARTMENT_OTHER)

## 2024-04-17 ENCOUNTER — Emergency Department (HOSPITAL_BASED_OUTPATIENT_CLINIC_OR_DEPARTMENT_OTHER)
Admission: EM | Admit: 2024-04-17 | Discharge: 2024-04-17 | Disposition: A | Discharge status: Home / Self Care | Attending: Emergency Medicine | Admitting: Emergency Medicine

## 2024-04-17 ENCOUNTER — Other Ambulatory Visit: Payer: Self-pay

## 2024-04-17 DIAGNOSIS — Z8619 Personal history of other infectious and parasitic diseases: Secondary | ICD-10-CM | POA: Diagnosis not present

## 2024-04-17 DIAGNOSIS — R197 Diarrhea, unspecified: Secondary | ICD-10-CM | POA: Diagnosis present

## 2024-04-17 DIAGNOSIS — R630 Anorexia: Secondary | ICD-10-CM | POA: Insufficient documentation

## 2024-04-17 DIAGNOSIS — I10 Essential (primary) hypertension: Secondary | ICD-10-CM | POA: Diagnosis not present

## 2024-04-17 DIAGNOSIS — Z79899 Other long term (current) drug therapy: Secondary | ICD-10-CM | POA: Insufficient documentation

## 2024-04-17 DIAGNOSIS — R109 Unspecified abdominal pain: Secondary | ICD-10-CM | POA: Diagnosis not present

## 2024-04-17 DIAGNOSIS — Z794 Long term (current) use of insulin: Secondary | ICD-10-CM | POA: Diagnosis not present

## 2024-04-17 DIAGNOSIS — E119 Type 2 diabetes mellitus without complications: Secondary | ICD-10-CM | POA: Insufficient documentation

## 2024-04-17 DIAGNOSIS — R112 Nausea with vomiting, unspecified: Secondary | ICD-10-CM | POA: Diagnosis not present

## 2024-04-17 DIAGNOSIS — Z8719 Personal history of other diseases of the digestive system: Secondary | ICD-10-CM

## 2024-04-17 LAB — COMPREHENSIVE METABOLIC PANEL WITH GFR
ALT: 23 U/L (ref 0–44)
AST: 19 U/L (ref 15–41)
Albumin: 4.3 g/dL (ref 3.5–5.0)
Alkaline Phosphatase: 131 U/L — ABNORMAL HIGH (ref 38–126)
Anion gap: 14 (ref 5–15)
BUN: 10 mg/dL (ref 6–20)
CO2: 19 mmol/L — ABNORMAL LOW (ref 22–32)
Calcium: 9.5 mg/dL (ref 8.9–10.3)
Chloride: 102 mmol/L (ref 98–111)
Creatinine, Ser: 0.84 mg/dL (ref 0.61–1.24)
GFR, Estimated: >60 mL/min (ref >60)
Glucose, Bld: 307 mg/dL — ABNORMAL HIGH (ref 70–99)
Potassium: 3.6 mmol/L (ref 3.5–5.1)
Sodium: 135 mmol/L (ref 135–145)
Total Bilirubin: 0.6 mg/dL (ref 0.0–1.2)
Total Protein: 7.5 g/dL (ref 6.5–8.1)

## 2024-04-17 LAB — CBC WITH DIFFERENTIAL/PLATELET
Abs Immature Granulocytes: 0.04 10*3/uL (ref 0.00–0.07)
Basophils Absolute: 0 10*3/uL (ref 0.0–0.1)
Basophils Relative: 0 %
Eosinophils Absolute: 0.4 10*3/uL (ref 0.0–0.5)
Eosinophils Relative: 3 %
HCT: 49.5 % (ref 39.0–52.0)
Hemoglobin: 18 g/dL — ABNORMAL HIGH (ref 13.0–17.0)
Immature Granulocytes: 0 %
Lymphocytes Relative: 24 %
Lymphs Abs: 3.2 10*3/uL (ref 0.7–4.0)
MCH: 30.2 pg (ref 26.0–34.0)
MCHC: 36.4 g/dL — ABNORMAL HIGH (ref 30.0–36.0)
MCV: 83.1 fL (ref 80.0–100.0)
Monocytes Absolute: 0.9 10*3/uL (ref 0.1–1.0)
Monocytes Relative: 7 %
Neutro Abs: 8.7 10*3/uL — ABNORMAL HIGH (ref 1.7–7.7)
Neutrophils Relative %: 66 %
Platelets: 304 10*3/uL (ref 150–400)
RBC: 5.96 MIL/uL — ABNORMAL HIGH (ref 4.22–5.81)
RDW: 12.5 % (ref 11.5–15.5)
WBC: 13.2 10*3/uL — ABNORMAL HIGH (ref 4.0–10.5)
nRBC: 0 % (ref 0.0–0.2)

## 2024-04-17 LAB — DIFFERENTIAL
Abs Immature Granulocytes: 0.05 10*3/uL (ref 0.00–0.07)
Basophils Absolute: 0 10*3/uL (ref 0.0–0.1)
Basophils Relative: 0 %
Eosinophils Absolute: 0.4 10*3/uL (ref 0.0–0.5)
Eosinophils Relative: 3 %
Immature Granulocytes: 0 %
Lymphocytes Relative: 24 %
Lymphs Abs: 3.2 10*3/uL (ref 0.7–4.0)
Monocytes Absolute: 0.8 10*3/uL (ref 0.1–1.0)
Monocytes Relative: 6 %
Neutro Abs: 8.8 10*3/uL — ABNORMAL HIGH (ref 1.7–7.7)
Neutrophils Relative %: 67 %

## 2024-04-17 LAB — CBC
HCT: 48.8 % (ref 39.0–52.0)
Hemoglobin: 17.9 g/dL — ABNORMAL HIGH (ref 13.0–17.0)
MCH: 30.3 pg (ref 26.0–34.0)
MCHC: 36.7 g/dL — ABNORMAL HIGH (ref 30.0–36.0)
MCV: 82.6 fL (ref 80.0–100.0)
Platelets: 296 10*3/uL (ref 150–400)
RBC: 5.91 MIL/uL — ABNORMAL HIGH (ref 4.22–5.81)
RDW: 12.4 % (ref 11.5–15.5)
WBC: 13.3 10*3/uL — ABNORMAL HIGH (ref 4.0–10.5)
nRBC: 0 % (ref 0.0–0.2)

## 2024-04-17 LAB — LACTIC ACID, PLASMA: Lactic Acid, Venous: 1.6 mmol/L (ref 0.5–1.9)

## 2024-04-17 LAB — LIPASE, BLOOD: Lipase: 23 U/L (ref 11–51)

## 2024-04-17 LAB — C DIFFICILE QUICK SCREEN W PCR REFLEX
C Diff antigen: NEGATIVE
C Diff interpretation: NOT DETECTED
C Diff toxin: NEGATIVE

## 2024-04-17 MED ORDER — DICYCLOMINE HCL 20 MG PO TABS: 20.0000 mg | ORAL_TABLET | Freq: Two times a day (BID) | ORAL | 0 refills | Status: AC

## 2024-04-17 MED ORDER — SODIUM CHLORIDE 0.9 % IV BOLUS
1000.0000 mL | Freq: Once | INTRAVENOUS | Status: AC
  Administered 2024-04-17: 1000 mL via INTRAVENOUS

## 2024-04-17 MED ORDER — MORPHINE SULFATE (PF) 4 MG/ML IV SOLN
4.0000 mg | Freq: Once | INTRAVENOUS | Status: AC
  Administered 2024-04-17: 4 mg via INTRAVENOUS
  Filled 2024-04-17: qty 1

## 2024-04-17 MED ORDER — DICYCLOMINE HCL 20 MG PO TABS: 20.0000 mg | ORAL_TABLET | Freq: Two times a day (BID) | ORAL | 0 refills | Status: DC

## 2024-04-17 MED ORDER — DICYCLOMINE HCL 10 MG PO CAPS
20.0000 mg | ORAL_CAPSULE | Freq: Once | ORAL | Status: AC
Start: 2024-04-17 — End: 2024-04-17
  Administered 2024-04-17: 20 mg via ORAL
  Filled 2024-04-17: qty 2

## 2024-04-17 MED ORDER — IOHEXOL 300 MG/ML  SOLN
100.0000 mL | Freq: Once | INTRAMUSCULAR | Status: AC | PRN
  Administered 2024-04-17: 100 mL via INTRAVENOUS

## 2024-04-17 MED ORDER — ONDANSETRON HCL 4 MG/2ML IJ SOLN
4.0000 mg | Freq: Once | INTRAMUSCULAR | Status: AC
  Administered 2024-04-17: 4 mg via INTRAVENOUS
  Filled 2024-04-17: qty 2

## 2024-04-17 NOTE — ED Triage Notes (Signed)
 C/o left flank/abd pain x 1 days. Hx of UC. States feels like a flare up. Denies fever.

## 2024-04-17 NOTE — ED Provider Notes (Signed)
  Physical Exam  BP (!) 154/97 (BP Location: Right Arm)   Pulse 74   Temp 98.6 F (37 C) (Oral)   Resp 16   SpO2 98%   Physical Exam  Procedures  Procedures  ED Course / MDM    Medical Decision Making Amount and/or Complexity of Data Reviewed Labs: ordered. Radiology: ordered.  Risk Prescription drug management.   H/o UC No currently on any meds Diarrhea x weeks - neg CT C diff panel pending If - for c diff - bentyl , bland diet GI panel pending, results tomororw. If anything positive he will be contacted.   C diff is negative. Wife and patient updated. Will discharge home per plan of previous treatment team. All questions answered.        Odell Balls, PA-C 04/17/24 1930    Armenta Canning, MD 05/02/24 [phone]

## 2024-04-17 NOTE — Discharge Instructions (Signed)
 As discussed, take Bentyl  for symptoms of abdominal pain and diarrhea. Follow up with your gastroenterologist for recheck in the outpatient setting.   Return to the ED With any high fever, severe pain or new concern.

## 2024-04-17 NOTE — ED Notes (Signed)
 Pt aware of the need for a stool sample... Pt unable to currently provide the sample.SABRASABRA

## 2024-04-17 NOTE — ED Provider Notes (Signed)
 [place] EMERGENCY DEPARTMENT AT [facility] Provider Note   CSN: [id number] Arrival date & time: 04/17/24  1127     Patient presents with: Abdominal Pain   Patrick Munoz is a 38 y.o. male.   Patrick Munoz is a 38 y.o. male with a history of ulcerative colitis, diabetes, hypertension, umbilical hernia s/p repair, who presents to the emergency department for evaluation of left-sided abdominal pain and diarrhea.  Patient reports that this feels like a flareup of his ulcerative colitis.  He reports due to difficulties with insurance coverage for medication and cost for seeing specialist he has not been on any chronic maintenance medications for his UC in the past 2 years.  He has had some issues with GI infections and colitis with his UC that once required hospitalization.  He reports for roughly 1 week he has been having some left-sided flank and abdominal pain and watery diarrhea.  He has not seen any blood in his diarrhea and reports it is mucousy or oily.  He reports he has had 5 bowel movements already today prior to arriving.  Went to his primary care provider and was referred here for further evaluation.  1 episode of nonbloody emesis this morning.  No fevers or chills.  Decreased appetite.  Has had a hernia repair has never required surgeries related to his ulcerative colitis.  The history is provided by the patient, the spouse and medical records.  Abdominal Pain Associated symptoms: diarrhea and nausea   Associated symptoms: no chills and no fever        Prior to Admission medications   Medication Sig Start Date End Date Taking? Authorizing Provider  acetaminophen  (TYLENOL ) 500 MG tablet Take 1,000 mg by mouth daily as needed for moderate pain, fever or headache.    [provider]  Blood Glucose Monitoring Suppl DEVI 1 each by Does not apply route 3 (three) times daily. May dispense any manufacturer covered by patient's insurance. 04/12/23   Rai,  Ripudeep K, MD  Continuous Glucose Sensor (FREESTYLE LIBRE 3 SENSOR) MISC USED TO CHECK BLOOD SUGARS, CHANGE SENSOR EVERY 14 DAYS DX: E11.65 28 DAYS 05/01/23   [provider]  dicyclomine  (BENTYL ) 20 MG tablet Take 1 tablet (20 mg total) by mouth 2 (two) times daily. 09/05/23   Ruthell Lonni FALCON, PA-C  gabapentin  (NEURONTIN ) 400 MG capsule Take 400 mg by mouth daily as needed (neuropathy, leg pain).    [provider]  insulin  NPH-regular Human (NOVOLIN  70/30) (70-30) 100 UNIT/ML injection Inject 22 Units into the skin 2 (two) times daily with a meal. Patient taking differently: Inject 40 Units into the skin 2 (two) times daily with a meal. 04/12/23   Rai, Ripudeep K, MD  Insulin  Syringe-Needle U-100 25G X 5/8 1 ML MISC 1 each by Does not apply route 3 (three) times daily. May dispense any manufacturer covered by patient's insurance. 04/12/23   Rai, Nydia POUR, MD  lisinopril -hydrochlorothiazide  (ZESTORETIC ) 10-12.5 MG tablet Take 1 tablet by mouth daily. 04/12/23   Rai, Nydia POUR, MD  NOVOLIN  R 100 UNIT/ML injection Inject 20 Units into the skin in the morning and at bedtime. Before largest meal and at bedtime. 08/30/23   [provider]  ondansetron  (ZOFRAN ) 4 MG tablet Take 1 tablet (4 mg total) by mouth every 6 (six) hours as needed for nausea or vomiting. 09/05/23   Ruthell Lonni FALCON, PA-C  rosuvastatin  (CRESTOR ) 40 MG tablet Take 1 tablet (40 mg total) by mouth at  bedtime. 04/12/23   Rai, Nydia POUR, MD    Allergies: Asa [aspirin ], Lipitor [atorvastatin ], Metformin and related, and Penicillins    Review of Systems  Constitutional:  Negative for chills and fever.  Gastrointestinal:  Positive for abdominal pain, diarrhea and nausea.  All other systems reviewed and are negative.   Updated Vital Signs BP (!) 153/105   Pulse (!) 106   Temp 98.9 F (37.2 C)   Resp 16   SpO2 99%   Physical Exam Vitals and nursing note reviewed.  Constitutional:       General: He is not in acute distress.    Appearance: Normal appearance. He is well-developed. He is obese. He is not ill-appearing or diaphoretic.  HENT:     Head: Normocephalic and atraumatic.  Eyes:     General:        Right eye: No discharge.        Left eye: No discharge.     Pupils: Pupils are equal, round, and reactive to light.  Cardiovascular:     Rate and Rhythm: Normal rate and regular rhythm.     Pulses: Normal pulses.     Heart sounds: Normal heart sounds.  Pulmonary:     Effort: Pulmonary effort is normal. No respiratory distress.     Breath sounds: Normal breath sounds. No wheezing or rales.     Comments: Respirations equal and unlabored, patient able to speak in full sentences, lungs clear to auscultation bilaterally  Abdominal:     General: Bowel sounds are normal. There is no distension.     Palpations: Abdomen is soft. There is no mass.     Tenderness: There is abdominal tenderness in the right lower quadrant, suprapubic area and left upper quadrant. There is no guarding.     Comments: Abdomen soft, nondistended, bowel sounds hyperactive, tenderness in the left upper quadrant and right lower and suprapubic regions without guarding or rebound tenderness, no CVA tenderness  Musculoskeletal:        General: No deformity.     Cervical back: Neck supple.  Skin:    General: Skin is warm and dry.     Capillary Refill: Capillary refill takes less than 2 seconds.  Neurological:     Mental Status: He is alert and oriented to person, place, and time.     Coordination: Coordination normal.     Comments: Speech is clear, able to follow commands CN III-XII intact Normal strength in upper and lower extremities bilaterally including dorsiflexion and plantar flexion, strong and equal grip strength Sensation normal to light and sharp touch Moves extremities without ataxia, coordination intact  Psychiatric:        Mood and Affect: Mood normal.        Behavior: Behavior normal.      (all labs ordered are listed, but only abnormal results are displayed) Labs Reviewed  COMPREHENSIVE METABOLIC PANEL WITH GFR - Abnormal; Notable for the following components:      Result Value   CO2 19 (*)    Glucose, Bld 307 (*)    Alkaline Phosphatase 131 (*)    All other components within normal limits  CBC - Abnormal; Notable for the following components:   WBC 13.3 (*)    RBC 5.91 (*)    Hemoglobin 17.9 (*)    MCHC 36.7 (*)    All other components within normal limits  DIFFERENTIAL - Abnormal; Notable for the following components:   Neutro Abs 8.8 (*)    All  other components within normal limits  CBC WITH DIFFERENTIAL/PLATELET - Abnormal; Notable for the following components:   WBC 13.2 (*)    RBC 5.96 (*)    Hemoglobin 18.0 (*)    MCHC 36.4 (*)    Neutro Abs 8.7 (*)    All other components within normal limits  GASTROINTESTINAL PANEL BY PCR, STOOL (REPLACES STOOL CULTURE)  C DIFFICILE QUICK SCREEN W PCR REFLEX    LIPASE, BLOOD  LACTIC ACID, PLASMA  LACTIC ACID, PLASMA  URINALYSIS, ROUTINE W REFLEX MICROSCOPIC    EKG: None  Radiology: CT ABDOMEN PELVIS W CONTRAST Result Date: 04/17/2024 EXAM:  CT ABDOMEN PELVIS WITH IV CONTRAST INDICATION:  Abdominal pain, acute, nonlocalized; Hx of UC, feels like a flair TECHNIQUE: Spiral CT scanning was performed through the abdomen and pelvis after the patient received IV contrast. COMPARISON: 09/05/2023 FINDINGS: There is no significant abnormality identified in the lung bases, liver, spleen, and pancreas. Cholelithiasis is present. No renal calculus, obstruction, or soft tissue mass is present. There are no adrenal masses. The colon is primarily fluid-filled. No bowel wall thickening or adjacent inflammation is present. The small bowel loops are unremarkable. There is no evidence of ascites or adenopathy. No abdominal aortic aneurysm is present. The bladder, prostate and seminal vesicles have a normal appearance. There is no  inguinal hernia. There is fluid in the rectosigmoid. There is no fracture or bone destruction. IMPRESSION: 1. Fluid-filled colon and rectum, consistent with diarrheal illness. 2. No colonic wall thickening or adjacent inflammation is identified. Please note that CT scanning at this site utilizes multiple dose reduction techniques, including automatic exposure control, adjustment of the MAA and/or KVP according to the patient's size, and use of iterative reconstruction. Electronically signed by: Eddy Oar MD 04/17/2024 03:25 PM EDT RP Workstation: [id number]     Procedures   Medications Ordered in the ED  ondansetron  (ZOFRAN ) injection 4 mg (4 mg Intravenous Given 04/17/24 1333)  morphine  (PF) 4 MG/ML injection 4 mg (4 mg Intravenous Given 04/17/24 1334)  sodium chloride  0.9 % bolus 1,000 mL (0 mLs Intravenous Stopped 04/17/24 1402)  iohexol  (OMNIPAQUE ) 300 MG/ML solution 100 mL (100 mLs Intravenous Contrast Given 04/17/24 1507)  morphine  (PF) 4 MG/ML injection 4 mg (4 mg Intravenous Given 04/17/24 1646)  dicyclomine  (BENTYL ) capsule 20 mg (20 mg Oral Given 04/17/24 1645)                                    Medical Decision Making Amount and/or Complexity of Data Reviewed Labs: ordered. Radiology: ordered.  Risk Prescription drug management.   38 y.o. male presents to the ED with complaints of abdominal pain and diarrhea, this involves an extensive number of treatment options, and is a complaint that carries with it a high risk of complications and morbidity.  The differential diagnosis includes GI bug, gastroenteritis, colitis, C. difficile, ulcerative colitis flare, obstruction, perforation, diverticulitis   On arrival pt is nontoxic, vitals show mildly elevated heart rate of 106 on arrival and mild hypertension.   Additional history obtained from wife at bedside. Previous records obtained and reviewed   I ordered medication including IV fluid bolus, morphine  and Zofran  for symptom  management, second round of pain medication given including morphine  and Bentyl   Lab Tests:  I Ordered, reviewed, and interpreted labs, which included: Mild leukocytosis of 13.3, hemoglobin of 7.9, no significant electrolyte derangements, glucose of 307 but no anion gap,  normal renal function, alk phos of 131 but otherwise normal LFTs, lactic acid WNL, C. difficile and GI pathogen panel pending  Imaging Studies ordered:  I ordered imaging studies which included CT abdomen pelvis, I independently visualized and interpreted imaging which showed fluid-filled colon and rectum consistent with diarrheal illness, no colonic wall thickening or adjacent inflammation identified to suggest colitis or UC complication  ED Course:   Pain improved with treatment here in the ED.  Stool studies are pending.  Fortunately no obvious wall thickening or evidence of colitis.  If C. difficile returns positive we will treat with antibiotics, but otherwise we will treat symptomatically for diarrheal illness    Portions of this note were generated with Dragon dictation software. Dictation errors may occur despite best attempts at proofreading.      Final diagnoses:  Diarrhea, unspecified type  History of ulcerative colitis    ED Discharge Orders          Ordered    dicyclomine  (BENTYL ) 20 MG tablet  2 times daily,   Status:  Discontinued        04/17/24 1931    dicyclomine  (BENTYL ) 20 MG tablet  2 times daily        04/17/24 1957               Alva Larraine JULIANNA DEVONNA 04/23/24 2155    Mannie Pac T, DO 04/26/24 1422

## 2024-04-18 LAB — GASTROINTESTINAL PANEL BY PCR, STOOL (REPLACES STOOL CULTURE)
